# Patient Record
Sex: Male | Born: 1955 | Race: White | Hispanic: No | Marital: Married | Smoking: Never smoker
Health system: Southern US, Community
[De-identification: ages and names within clinical notes are randomized; demographics above are authoritative.]

## PROBLEM LIST (undated history)

## (undated) DIAGNOSIS — M1990 Unspecified osteoarthritis, unspecified site: Secondary | ICD-10-CM

## (undated) DIAGNOSIS — S46011A Strain of muscle(s) and tendon(s) of the rotator cuff of right shoulder, initial encounter: Secondary | ICD-10-CM

## (undated) DIAGNOSIS — G479 Sleep disorder, unspecified: Secondary | ICD-10-CM

## (undated) DIAGNOSIS — I4891 Unspecified atrial fibrillation: Secondary | ICD-10-CM

## (undated) DIAGNOSIS — I10 Essential (primary) hypertension: Secondary | ICD-10-CM

## (undated) DIAGNOSIS — Z7901 Long term (current) use of anticoagulants: Secondary | ICD-10-CM

## (undated) DIAGNOSIS — I4510 Unspecified right bundle-branch block: Secondary | ICD-10-CM

## (undated) DIAGNOSIS — E669 Obesity, unspecified: Secondary | ICD-10-CM

## (undated) DIAGNOSIS — G4730 Sleep apnea, unspecified: Secondary | ICD-10-CM

## (undated) DIAGNOSIS — E785 Hyperlipidemia, unspecified: Secondary | ICD-10-CM

## (undated) DIAGNOSIS — K219 Gastro-esophageal reflux disease without esophagitis: Secondary | ICD-10-CM

## (undated) DIAGNOSIS — E119 Type 2 diabetes mellitus without complications: Secondary | ICD-10-CM

## (undated) DIAGNOSIS — R0683 Snoring: Secondary | ICD-10-CM

## (undated) DIAGNOSIS — R7303 Prediabetes: Secondary | ICD-10-CM

## (undated) HISTORY — DX: Unspecified right bundle-branch block: I45.10

## (undated) HISTORY — DX: Unspecified atrial fibrillation: I48.91

## (undated) HISTORY — PX: FRACTURE SURGERY: SHX138

---

## 1988-12-19 DIAGNOSIS — C4490 Unspecified malignant neoplasm of skin, unspecified: Secondary | ICD-10-CM

## 1988-12-19 DIAGNOSIS — C801 Malignant (primary) neoplasm, unspecified: Secondary | ICD-10-CM

## 1988-12-19 HISTORY — DX: Unspecified malignant neoplasm of skin, unspecified: C44.90

## 1988-12-19 HISTORY — DX: Malignant (primary) neoplasm, unspecified: C80.1

## 1988-12-19 HISTORY — PX: OTHER SURGICAL HISTORY: SHX169

## 2015-12-01 ENCOUNTER — Other Ambulatory Visit
Admission: RE | Admit: 2015-12-01 | Discharge: 2015-12-01 | Disposition: A | Discharge status: Home / Self Care | Payer: 59 | Source: Ambulatory Visit | Attending: Unknown Physician Specialty | Admitting: Unknown Physician Specialty

## 2015-12-01 DIAGNOSIS — Z0289 Encounter for other administrative examinations: Secondary | ICD-10-CM | POA: Insufficient documentation

## 2015-12-01 LAB — BASIC METABOLIC PANEL
Anion gap: 12 (ref 5–15)
BUN: 15 mg/dL (ref 6–20)
CHLORIDE: 99 mmol/L — AB (ref 101–111)
CO2: 25 mmol/L (ref 22–32)
CREATININE: 0.79 mg/dL (ref 0.61–1.24)
Calcium: 9.6 mg/dL (ref 8.9–10.3)
GFR calc non Af Amer: >60 mL/min (ref >60)
Glucose, Bld: 113 mg/dL — ABNORMAL HIGH (ref 65–99)
POTASSIUM: 3.8 mmol/L (ref 3.5–5.1)
Sodium: 136 mmol/L (ref 135–145)

## 2015-12-01 LAB — CBC
HEMATOCRIT: 47.1 % (ref 40.0–52.0)
HEMOGLOBIN: 15.7 g/dL (ref 13.0–18.0)
MCH: 29.5 pg (ref 26.0–34.0)
MCHC: 33.3 g/dL (ref 32.0–36.0)
MCV: 88.5 fL (ref 80.0–100.0)
Platelets: 188 10*3/uL (ref 150–440)
RBC: 5.32 MIL/uL (ref 4.40–5.90)
RDW: 13.7 % (ref 11.5–14.5)
WBC: 8.8 10*3/uL (ref 3.8–10.6)

## 2015-12-01 LAB — HEPATIC FUNCTION PANEL
ALBUMIN: 4 g/dL (ref 3.5–5.0)
ALK PHOS: 64 U/L (ref 38–126)
ALT: 26 U/L (ref 17–63)
AST: 21 U/L (ref 15–41)
BILIRUBIN TOTAL: 0.7 mg/dL (ref 0.3–1.2)
Bilirubin, Direct: 0.1 mg/dL (ref 0.1–0.5)
Indirect Bilirubin: 0.6 mg/dL (ref 0.3–0.9)
TOTAL PROTEIN: 6.9 g/dL (ref 6.5–8.1)

## 2017-05-09 DIAGNOSIS — E119 Type 2 diabetes mellitus without complications: Secondary | ICD-10-CM | POA: Diagnosis not present

## 2017-05-09 DIAGNOSIS — E7800 Pure hypercholesterolemia, unspecified: Secondary | ICD-10-CM | POA: Diagnosis not present

## 2017-05-09 DIAGNOSIS — Z0000 Encounter for general adult medical examination without abnormal findings: Secondary | ICD-10-CM | POA: Diagnosis not present

## 2017-05-28 DIAGNOSIS — S4992XA Unspecified injury of left shoulder and upper arm, initial encounter: Secondary | ICD-10-CM | POA: Diagnosis not present

## 2017-05-28 DIAGNOSIS — M25512 Pain in left shoulder: Secondary | ICD-10-CM | POA: Diagnosis not present

## 2017-05-28 DIAGNOSIS — S46912A Strain of unspecified muscle, fascia and tendon at shoulder and upper arm level, left arm, initial encounter: Secondary | ICD-10-CM | POA: Diagnosis not present

## 2017-05-28 DIAGNOSIS — G8911 Acute pain due to trauma: Secondary | ICD-10-CM | POA: Diagnosis not present

## 2017-06-30 DIAGNOSIS — M5431 Sciatica, right side: Secondary | ICD-10-CM | POA: Diagnosis not present

## 2017-06-30 DIAGNOSIS — M1611 Unilateral primary osteoarthritis, right hip: Secondary | ICD-10-CM | POA: Diagnosis not present

## 2017-06-30 DIAGNOSIS — M5136 Other intervertebral disc degeneration, lumbar region: Secondary | ICD-10-CM | POA: Diagnosis not present

## 2017-06-30 DIAGNOSIS — M25551 Pain in right hip: Secondary | ICD-10-CM | POA: Diagnosis not present

## 2017-09-16 DIAGNOSIS — Z23 Encounter for immunization: Secondary | ICD-10-CM | POA: Diagnosis not present

## 2017-11-06 DIAGNOSIS — E7800 Pure hypercholesterolemia, unspecified: Secondary | ICD-10-CM | POA: Diagnosis not present

## 2017-11-06 DIAGNOSIS — I10 Essential (primary) hypertension: Secondary | ICD-10-CM | POA: Diagnosis not present

## 2017-11-06 DIAGNOSIS — E119 Type 2 diabetes mellitus without complications: Secondary | ICD-10-CM | POA: Diagnosis not present

## 2018-09-25 DIAGNOSIS — L02411 Cutaneous abscess of right axilla: Secondary | ICD-10-CM | POA: Diagnosis not present

## 2018-09-25 DIAGNOSIS — E119 Type 2 diabetes mellitus without complications: Secondary | ICD-10-CM | POA: Diagnosis not present

## 2018-10-05 DIAGNOSIS — I10 Essential (primary) hypertension: Secondary | ICD-10-CM | POA: Diagnosis not present

## 2018-10-05 DIAGNOSIS — E7800 Pure hypercholesterolemia, unspecified: Secondary | ICD-10-CM | POA: Diagnosis not present

## 2018-10-05 DIAGNOSIS — E119 Type 2 diabetes mellitus without complications: Secondary | ICD-10-CM | POA: Diagnosis not present

## 2018-10-12 DIAGNOSIS — Z0000 Encounter for general adult medical examination without abnormal findings: Secondary | ICD-10-CM | POA: Diagnosis not present

## 2018-10-12 DIAGNOSIS — E118 Type 2 diabetes mellitus with unspecified complications: Secondary | ICD-10-CM | POA: Diagnosis not present

## 2018-10-12 DIAGNOSIS — I10 Essential (primary) hypertension: Secondary | ICD-10-CM | POA: Diagnosis not present

## 2018-10-12 DIAGNOSIS — E7800 Pure hypercholesterolemia, unspecified: Secondary | ICD-10-CM | POA: Diagnosis not present

## 2018-10-12 DIAGNOSIS — Z23 Encounter for immunization: Secondary | ICD-10-CM | POA: Diagnosis not present

## 2018-10-18 DIAGNOSIS — I10 Essential (primary) hypertension: Secondary | ICD-10-CM | POA: Diagnosis not present

## 2018-10-18 DIAGNOSIS — E7800 Pure hypercholesterolemia, unspecified: Secondary | ICD-10-CM | POA: Diagnosis not present

## 2018-10-18 DIAGNOSIS — E118 Type 2 diabetes mellitus with unspecified complications: Secondary | ICD-10-CM | POA: Diagnosis not present

## 2018-10-18 DIAGNOSIS — Z23 Encounter for immunization: Secondary | ICD-10-CM | POA: Diagnosis not present

## 2018-10-18 DIAGNOSIS — Z0000 Encounter for general adult medical examination without abnormal findings: Secondary | ICD-10-CM | POA: Diagnosis not present

## 2018-11-13 DIAGNOSIS — L739 Follicular disorder, unspecified: Secondary | ICD-10-CM | POA: Diagnosis not present

## 2018-11-20 DIAGNOSIS — L0390 Cellulitis, unspecified: Secondary | ICD-10-CM | POA: Diagnosis not present

## 2018-11-20 DIAGNOSIS — B9562 Methicillin resistant Staphylococcus aureus infection as the cause of diseases classified elsewhere: Secondary | ICD-10-CM | POA: Diagnosis not present

## 2019-04-05 DIAGNOSIS — I10 Essential (primary) hypertension: Secondary | ICD-10-CM | POA: Diagnosis not present

## 2019-04-05 DIAGNOSIS — E7800 Pure hypercholesterolemia, unspecified: Secondary | ICD-10-CM | POA: Diagnosis not present

## 2019-04-05 DIAGNOSIS — Z125 Encounter for screening for malignant neoplasm of prostate: Secondary | ICD-10-CM | POA: Diagnosis not present

## 2019-04-05 DIAGNOSIS — E118 Type 2 diabetes mellitus with unspecified complications: Secondary | ICD-10-CM | POA: Diagnosis not present

## 2019-04-11 DIAGNOSIS — I10 Essential (primary) hypertension: Secondary | ICD-10-CM | POA: Diagnosis not present

## 2019-04-11 DIAGNOSIS — E118 Type 2 diabetes mellitus with unspecified complications: Secondary | ICD-10-CM | POA: Diagnosis not present

## 2019-04-11 DIAGNOSIS — E7800 Pure hypercholesterolemia, unspecified: Secondary | ICD-10-CM | POA: Diagnosis not present

## 2019-05-31 ENCOUNTER — Telehealth: Payer: Self-pay | Admitting: Hematology

## 2019-05-31 DIAGNOSIS — Z20822 Contact with and (suspected) exposure to covid-19: Secondary | ICD-10-CM

## 2019-05-31 NOTE — Telephone Encounter (Signed)
Patient referred for COVID testing by Dr. Valere Dross / Left VM to call back [phone] between 7a-7p M-F / Order placed / If patient calls back he just needs to be scheduled at [facility]

## 2019-06-04 NOTE — Telephone Encounter (Signed)
Patient returned call and has been scheduled for testing.

## 2019-06-05 ENCOUNTER — Other Ambulatory Visit: Payer: 59

## 2019-06-05 DIAGNOSIS — Z20822 Contact with and (suspected) exposure to covid-19: Secondary | ICD-10-CM

## 2019-06-06 LAB — NOVEL CORONAVIRUS, NAA: SARS-CoV-2, NAA: NOT DETECTED

## 2020-03-27 ENCOUNTER — Ambulatory Visit: Payer: Self-pay | Attending: Internal Medicine

## 2020-03-27 DIAGNOSIS — Z23 Encounter for immunization: Secondary | ICD-10-CM

## 2020-03-27 NOTE — Progress Notes (Signed)
   Covid-19 Vaccination Clinic  Name:  TREZ CONLIFFE    MRN: [id number] DOB: May 27, 1956  03/27/2020  Mr. Coriell was observed post Covid-19 immunization for 15 minutes without incident. He was provided with Vaccine Information Sheet and instruction to access the V-Safe system.   Mr. Cubias was instructed to call 911 with any severe reactions post vaccine: Marland Kitchen Difficulty breathing  . Swelling of face and throat  . A fast heartbeat  . A bad rash all over body  . Dizziness and weakness   Immunizations Administered    Name Date Dose VIS Date Route   Pfizer COVID-19 Vaccine 03/27/2020 11:29 AM 0.3 mL 11/29/2019 Intramuscular   Manufacturer: [place]   Lot: [id number]   [place]: [id number]

## 2020-04-21 ENCOUNTER — Ambulatory Visit: Payer: Self-pay | Attending: Internal Medicine

## 2020-04-21 DIAGNOSIS — Z23 Encounter for immunization: Secondary | ICD-10-CM

## 2020-04-21 NOTE — Progress Notes (Signed)
   Covid-19 Vaccination Clinic  Name:  DELSHAWN COKLEY    MRN: [id number] DOB: Dec 23, 1955  04/21/2020  Mr. Claytor was observed post Covid-19 immunization for 15 minutes without incident. He was provided with Vaccine Information Sheet and instruction to access the V-Safe system.   Mr. Trester was instructed to call 911 with any severe reactions post vaccine: Marland Kitchen Difficulty breathing  . Swelling of face and throat  . A fast heartbeat  . A bad rash all over body  . Dizziness and weakness   Immunizations Administered    Name Date Dose VIS Date Route   Pfizer COVID-19 Vaccine 04/21/2020  1:20 PM 0.3 mL 02/12/2019 Intramuscular   Manufacturer: [place]   Lot: [id number]   [place]: [id number]

## 2020-12-16 ENCOUNTER — Other Ambulatory Visit: Payer: Self-pay

## 2020-12-16 ENCOUNTER — Other Ambulatory Visit: Payer: BC Managed Care – PPO

## 2020-12-16 DIAGNOSIS — Z20822 Contact with and (suspected) exposure to covid-19: Secondary | ICD-10-CM

## 2020-12-17 LAB — SARS-COV-2, NAA 2 DAY TAT

## 2020-12-17 LAB — NOVEL CORONAVIRUS, NAA: SARS-CoV-2, NAA: NOT DETECTED

## 2020-12-25 ENCOUNTER — Other Ambulatory Visit: Payer: Self-pay | Admitting: Student

## 2020-12-25 DIAGNOSIS — M7581 Other shoulder lesions, right shoulder: Secondary | ICD-10-CM

## 2020-12-28 ENCOUNTER — Other Ambulatory Visit: Payer: Self-pay | Admitting: Student

## 2020-12-28 DIAGNOSIS — M7581 Other shoulder lesions, right shoulder: Secondary | ICD-10-CM

## 2021-01-08 ENCOUNTER — Other Ambulatory Visit: Payer: BC Managed Care – PPO

## 2021-01-19 ENCOUNTER — Ambulatory Visit
Admission: RE | Admit: 2021-01-19 | Discharge: 2021-01-19 | Disposition: A | Discharge status: Home / Self Care | Payer: BC Managed Care – PPO | Source: Ambulatory Visit | Attending: Student | Admitting: Student

## 2021-01-19 ENCOUNTER — Other Ambulatory Visit: Payer: Self-pay

## 2021-01-19 DIAGNOSIS — M7581 Other shoulder lesions, right shoulder: Secondary | ICD-10-CM

## 2021-04-07 ENCOUNTER — Other Ambulatory Visit: Payer: Self-pay | Admitting: Surgery

## 2021-04-16 ENCOUNTER — Other Ambulatory Visit
Admission: RE | Admit: 2021-04-16 | Discharge: 2021-04-16 | Disposition: A | Discharge status: Home / Self Care | Payer: BC Managed Care – PPO | Source: Ambulatory Visit | Attending: Surgery | Admitting: Surgery

## 2021-04-16 ENCOUNTER — Other Ambulatory Visit: Payer: Self-pay

## 2021-04-16 HISTORY — DX: Prediabetes: R73.03

## 2021-04-16 HISTORY — DX: Unspecified osteoarthritis, unspecified site: M19.90

## 2021-04-16 HISTORY — DX: Gastro-esophageal reflux disease without esophagitis: K21.9

## 2021-04-16 HISTORY — DX: Essential (primary) hypertension: I10

## 2021-04-16 NOTE — Patient Instructions (Signed)
Your procedure is scheduled on: Tuesday Apr 27, 2021. Report to Day Surgery inside [facility] 2nd floor (stop by admissions desk first before getting on elevator). To find out your arrival time please call [phone] between 1PM - 3PM on Monday Apr 26, 2021.  Remember: Instructions that are not followed completely may result in serious medical risk,  up to and including death, or upon the discretion of your surgeon and anesthesiologist your  surgery may need to be rescheduled.     _X__ 1. Do not eat food after midnight the night before your procedure.                 No chewing gum or hard candies. You may drink clear liquids up to 2 hours                 before you are scheduled to arrive for your surgery- DO not drink clear                 liquids within 2 hours of the start of your surgery.                 Clear Liquids include:  water, apple juice without pulp, G2 or                  Gatorade Zero (avoid Red/Purple/Blue), Black Coffee or Tea (Do not add                 anything to coffee or tea).  __X__2.  On the morning of surgery brush your teeth with toothpaste and water, you                may rinse your mouth with mouthwash if you wish.  Do not swallow any toothpaste of mouthwash.     _X__ 3.  No Alcohol for 24 hours before or after surgery.   _X__ 4.  Do Not Smoke or use e-cigarettes For 24 Hours Prior to Your Surgery.                 Do not use any chewable tobacco products for at least 6 hours prior to                 Surgery.  _X__  5.  Do not use any recreational drugs (marijuana, cocaine, heroin, ecstasy, MDMA or other)                For at least one week prior to your surgery.  Combination of these drugs with anesthesia                May have life threatening results.   __X__6.  Notify your doctor if there is any change in your medical condition      (cold, fever, infections).     Do not wear jewelry, make-up, hairpins, clips or nail  polish. Do not wear lotions, powders, or perfumes. You may wear deodorant. Do not shave 48 hours prior to surgery. Men may shave face and neck. Do not bring valuables to the hospital.    [facility] is not responsible for any belongings or valuables.  Contacts, dentures or bridgework may not be worn into surgery. Leave your suitcase in the car. After surgery it may be brought to your room. For patients admitted to the hospital, discharge time is determined by your treatment team.   Patients discharged the day of surgery will not be allowed to drive home.  Make arrangements for someone to be with you for the first 24 hours of your Same Day Discharge.   __X__ Take these medicines the morning of surgery with A SIP OF WATER:    1. amLODipine (NORVASC) 5 MG   2. omeprazole (PRILOSEC) 40 MG  3.   4.  5.  6.  ____ Fleet Enema (as directed)   __X__ Use CHG Soap (or wipes) as directed  ____ Use Benzoyl Peroxide Gel as instructed  ____ Use inhalers on the day of surgery  ____ Stop metformin 2 days prior to surgery    ____ Take 1/2 of usual insulin dose the night before surgery. No insulin the morning          of surgery.   ____ Call your PCP, cardiologist, or Pulmonologist if taking Coumadin/Plavix/aspirin and ask when to stop before your surgery.   __X__ One Week prior to surgery- Stop Anti-inflammatories such as Ibuprofen, Aleve, Advil, Motrin, meloxicam (MOBIC), diclofenac, etodolac, ketorolac, Toradol, Daypro, piroxicam, Goody's or BC powders. OK TO USE TYLENOL IF NEEDED   __X__ Stop supplements until after surgery. Multiple Vitamin (MULTIVITAMIN WITH MINERALS), Melatonin and Cyanocobalamin (VITAMIN B-12)   ____ Bring C-Pap to the hospital.    If you have any questions regarding your pre-procedure instructions,  Please call Pre-admit Testing at [phone].

## 2021-04-21 ENCOUNTER — Other Ambulatory Visit: Payer: Self-pay

## 2021-04-21 ENCOUNTER — Encounter
Admission: RE | Admit: 2021-04-21 | Discharge: 2021-04-21 | Disposition: A | Discharge status: Home / Self Care | Payer: BC Managed Care – PPO | Source: Ambulatory Visit | Attending: Surgery | Admitting: Surgery

## 2021-04-21 ENCOUNTER — Other Ambulatory Visit: Admission: RE | Admit: 2021-04-21 | Payer: BC Managed Care – PPO | Source: Ambulatory Visit

## 2021-04-21 DIAGNOSIS — I10 Essential (primary) hypertension: Secondary | ICD-10-CM | POA: Insufficient documentation

## 2021-04-21 DIAGNOSIS — Z01818 Encounter for other preprocedural examination: Secondary | ICD-10-CM | POA: Insufficient documentation

## 2021-04-21 LAB — CBC
HCT: 46.5 % (ref 39.0–52.0)
Hemoglobin: 15.7 g/dL (ref 13.0–17.0)
MCH: 29.8 pg (ref 26.0–34.0)
MCHC: 33.8 g/dL (ref 30.0–36.0)
MCV: 88.2 fL (ref 80.0–100.0)
Platelets: 222 10*3/uL (ref 150–400)
RBC: 5.27 MIL/uL (ref 4.22–5.81)
RDW: 13.2 % (ref 11.5–15.5)
WBC: 8.4 10*3/uL (ref 4.0–10.5)
nRBC: 0 % (ref 0.0–0.2)

## 2021-04-26 MED ORDER — CHLORHEXIDINE GLUCONATE 0.12 % MT SOLN: 15.0000 mL | Freq: Once | OROMUCOSAL | Status: AC

## 2021-04-26 MED ORDER — ORAL CARE MOUTH RINSE: 15.0000 mL | Freq: Once | OROMUCOSAL | Status: AC

## 2021-04-26 MED ORDER — CEFAZOLIN IN SODIUM CHLORIDE 3-0.9 GM/100ML-% IV SOLN
3.0000 g | INTRAVENOUS | Status: DC
  Filled 2021-04-26: qty 100

## 2021-04-26 MED ORDER — SODIUM CHLORIDE 0.9 % IV SOLN: INTRAVENOUS | Status: DC

## 2021-04-27 ENCOUNTER — Other Ambulatory Visit: Payer: Self-pay

## 2021-04-27 ENCOUNTER — Encounter: Payer: Self-pay | Admitting: Anesthesiology

## 2021-04-27 ENCOUNTER — Encounter: Payer: Self-pay | Admitting: Surgery

## 2021-04-27 ENCOUNTER — Ambulatory Visit: Payer: BC Managed Care – PPO

## 2021-04-27 ENCOUNTER — Observation Stay
Admission: EM | Admit: 2021-04-27 | Discharge: 2021-04-28 | Disposition: A | Discharge status: Home / Self Care | Payer: BC Managed Care – PPO | Attending: Internal Medicine | Admitting: Internal Medicine

## 2021-04-27 ENCOUNTER — Encounter
Admission: RE | Disposition: A | Discharge status: Home / Self Care | Payer: Self-pay | Source: Home / Self Care | Attending: Surgery

## 2021-04-27 ENCOUNTER — Observation Stay (HOSPITAL_BASED_OUTPATIENT_CLINIC_OR_DEPARTMENT_OTHER)
Admit: 2021-04-27 | Discharge: 2021-04-27 | Disposition: A | Discharge status: Home / Self Care | Payer: BC Managed Care – PPO | Attending: Internal Medicine | Admitting: Internal Medicine

## 2021-04-27 ENCOUNTER — Ambulatory Visit
Admission: RE | Admit: 2021-04-27 | Discharge: 2021-04-27 | Disposition: A | Discharge status: Home / Self Care | Payer: BC Managed Care – PPO | Source: Home / Self Care | Attending: Surgery | Admitting: Surgery

## 2021-04-27 ENCOUNTER — Emergency Department: Payer: BC Managed Care – PPO

## 2021-04-27 DIAGNOSIS — Z01818 Encounter for other preprocedural examination: Secondary | ICD-10-CM | POA: Insufficient documentation

## 2021-04-27 DIAGNOSIS — Z85828 Personal history of other malignant neoplasm of skin: Secondary | ICD-10-CM | POA: Insufficient documentation

## 2021-04-27 DIAGNOSIS — E119 Type 2 diabetes mellitus without complications: Secondary | ICD-10-CM | POA: Diagnosis not present

## 2021-04-27 DIAGNOSIS — E782 Mixed hyperlipidemia: Secondary | ICD-10-CM

## 2021-04-27 DIAGNOSIS — Z79899 Other long term (current) drug therapy: Secondary | ICD-10-CM | POA: Diagnosis not present

## 2021-04-27 DIAGNOSIS — Z20822 Contact with and (suspected) exposure to covid-19: Secondary | ICD-10-CM | POA: Diagnosis not present

## 2021-04-27 DIAGNOSIS — R0602 Shortness of breath: Secondary | ICD-10-CM | POA: Insufficient documentation

## 2021-04-27 DIAGNOSIS — I4891 Unspecified atrial fibrillation: Secondary | ICD-10-CM

## 2021-04-27 DIAGNOSIS — E65 Localized adiposity: Secondary | ICD-10-CM

## 2021-04-27 DIAGNOSIS — Z419 Encounter for procedure for purposes other than remedying health state, unspecified: Secondary | ICD-10-CM

## 2021-04-27 DIAGNOSIS — E669 Obesity, unspecified: Secondary | ICD-10-CM

## 2021-04-27 DIAGNOSIS — I10 Essential (primary) hypertension: Secondary | ICD-10-CM | POA: Diagnosis not present

## 2021-04-27 DIAGNOSIS — I480 Paroxysmal atrial fibrillation: Secondary | ICD-10-CM

## 2021-04-27 DIAGNOSIS — M75101 Unspecified rotator cuff tear or rupture of right shoulder, not specified as traumatic: Secondary | ICD-10-CM

## 2021-04-27 DIAGNOSIS — R9431 Abnormal electrocardiogram [ECG] [EKG]: Secondary | ICD-10-CM

## 2021-04-27 DIAGNOSIS — E785 Hyperlipidemia, unspecified: Secondary | ICD-10-CM

## 2021-04-27 DIAGNOSIS — Z538 Procedure and treatment not carried out for other reasons: Secondary | ICD-10-CM | POA: Insufficient documentation

## 2021-04-27 DIAGNOSIS — E1169 Type 2 diabetes mellitus with other specified complication: Secondary | ICD-10-CM

## 2021-04-27 HISTORY — DX: Type 2 diabetes mellitus without complications: E11.9

## 2021-04-27 HISTORY — DX: Unspecified right bundle-branch block: I45.10

## 2021-04-27 HISTORY — DX: Obesity, unspecified: E66.9

## 2021-04-27 HISTORY — DX: Snoring: R06.83

## 2021-04-27 HISTORY — DX: Hyperlipidemia, unspecified: E78.5

## 2021-04-27 LAB — CBC
HCT: 48.1 % (ref 39.0–52.0)
Hemoglobin: 16.6 g/dL (ref 13.0–17.0)
MCH: 30.5 pg (ref 26.0–34.0)
MCHC: 34.5 g/dL (ref 30.0–36.0)
MCV: 88.3 fL (ref 80.0–100.0)
Platelets: 232 10*3/uL (ref 150–400)
RBC: 5.45 MIL/uL (ref 4.22–5.81)
RDW: 13.4 % (ref 11.5–15.5)
WBC: 11.3 10*3/uL — ABNORMAL HIGH (ref 4.0–10.5)
nRBC: 0 % (ref 0.0–0.2)

## 2021-04-27 LAB — GLUCOSE, CAPILLARY
Glucose-Capillary: 122 mg/dL — ABNORMAL HIGH (ref 70–99)
Glucose-Capillary: 124 mg/dL — ABNORMAL HIGH (ref 70–99)

## 2021-04-27 LAB — BASIC METABOLIC PANEL
Anion gap: 11 (ref 5–15)
BUN: 20 mg/dL (ref 8–23)
CO2: 22 mmol/L (ref 22–32)
Calcium: 9.4 mg/dL (ref 8.9–10.3)
Chloride: 103 mmol/L (ref 98–111)
Creatinine, Ser: 0.87 mg/dL (ref 0.61–1.24)
GFR, Estimated: >60 mL/min (ref >60)
Glucose, Bld: 114 mg/dL — ABNORMAL HIGH (ref 70–99)
Potassium: 4 mmol/L (ref 3.5–5.1)
Sodium: 136 mmol/L (ref 135–145)

## 2021-04-27 LAB — TROPONIN I (HIGH SENSITIVITY)
Troponin I (High Sensitivity): 4 ng/L (ref <18)
Troponin I (High Sensitivity): 4 ng/L (ref <18)

## 2021-04-27 LAB — HIV ANTIBODY (ROUTINE TESTING W REFLEX): HIV Screen 4th Generation wRfx: NONREACTIVE

## 2021-04-27 LAB — TSH: TSH: 1.948 u[IU]/mL (ref 0.350–4.500)

## 2021-04-27 LAB — RESP PANEL BY RT-PCR (FLU A&B, COVID) ARPGX2
Influenza A by PCR: NEGATIVE
Influenza B by PCR: NEGATIVE
SARS Coronavirus 2 by RT PCR: NEGATIVE

## 2021-04-27 LAB — BRAIN NATRIURETIC PEPTIDE: B Natriuretic Peptide: 395.7 pg/mL — ABNORMAL HIGH (ref 0.0–100.0)

## 2021-04-27 LAB — PROCALCITONIN: Procalcitonin: <0.1 ng/mL

## 2021-04-27 LAB — MAGNESIUM: Magnesium: 2.1 mg/dL (ref 1.7–2.4)

## 2021-04-27 SURGERY — ARTHROSCOPY, SHOULDER WITH REPAIR, ROTATOR CUFF, OPEN
Anesthesia: Choice | Site: Shoulder | Laterality: Right

## 2021-04-27 MED ORDER — CHLORHEXIDINE GLUCONATE 0.12 % MT SOLN
OROMUCOSAL | Status: AC
  Administered 2021-04-27: 15 mL via OROMUCOSAL
  Filled 2021-04-27: qty 15

## 2021-04-27 MED ORDER — ONDANSETRON HCL 4 MG/2ML IJ SOLN: 4.0000 mg | Freq: Four times a day (QID) | INTRAMUSCULAR | Status: DC | PRN

## 2021-04-27 MED ORDER — DILTIAZEM LOAD VIA INFUSION
10.0000 mg | Freq: Once | INTRAVENOUS | Status: AC
  Administered 2021-04-27: 10 mg via INTRAVENOUS
  Filled 2021-04-27: qty 10

## 2021-04-27 MED ORDER — ACETAMINOPHEN 650 MG RE SUPP: 650.0000 mg | Freq: Four times a day (QID) | RECTAL | Status: DC | PRN

## 2021-04-27 MED ORDER — MELATONIN 5 MG PO TABS
10.0000 mg | ORAL_TABLET | Freq: Every evening | ORAL | Status: DC | PRN
  Administered 2021-04-27: 10 mg via ORAL
  Filled 2021-04-27: qty 2

## 2021-04-27 MED ORDER — METOPROLOL TARTRATE 5 MG/5ML IV SOLN
INTRAVENOUS | Status: AC
  Filled 2021-04-27: qty 5

## 2021-04-27 MED ORDER — MIDAZOLAM HCL 2 MG/2ML IJ SOLN: 2.0000 mg | Freq: Once | INTRAMUSCULAR | Status: DC

## 2021-04-27 MED ORDER — VITAMIN B-12 5000 MCG SL SUBL: 5000.0000 ug | SUBLINGUAL_TABLET | Freq: Every morning | SUBLINGUAL | Status: DC

## 2021-04-27 MED ORDER — VITAMIN B-12 1000 MCG PO TABS
5000.0000 ug | ORAL_TABLET | Freq: Every day | ORAL | Status: DC
  Administered 2021-04-28: 5000 ug via ORAL
  Filled 2021-04-27: qty 5

## 2021-04-27 MED ORDER — MIDAZOLAM HCL 2 MG/2ML IJ SOLN
INTRAMUSCULAR | Status: AC
  Filled 2021-04-27: qty 2

## 2021-04-27 MED ORDER — ACETAMINOPHEN 325 MG PO TABS
650.0000 mg | ORAL_TABLET | Freq: Four times a day (QID) | ORAL | Status: DC | PRN
  Administered 2021-04-27: 650 mg via ORAL
  Filled 2021-04-27: qty 2

## 2021-04-27 MED ORDER — FENTANYL CITRATE (PF) 100 MCG/2ML IJ SOLN
INTRAMUSCULAR | Status: AC
  Filled 2021-04-27: qty 2

## 2021-04-27 MED ORDER — DILTIAZEM HCL-DEXTROSE 125-5 MG/125ML-% IV SOLN (PREMIX)
5.0000 mg/h | INTRAVENOUS | Status: DC
Start: 2021-04-27 — End: 2021-04-28
  Administered 2021-04-27: 7.5 mg/h via INTRAVENOUS
  Administered 2021-04-27: 5 mg/h via INTRAVENOUS
  Administered 2021-04-27 – 2021-04-28 (×2): 12.5 mg/h via INTRAVENOUS
  Filled 2021-04-27 (×3): qty 125

## 2021-04-27 MED ORDER — APIXABAN 5 MG PO TABS
5.0000 mg | ORAL_TABLET | Freq: Two times a day (BID) | ORAL | Status: DC
  Administered 2021-04-27 – 2021-04-28 (×2): 5 mg via ORAL
  Filled 2021-04-27 (×2): qty 1

## 2021-04-27 MED ORDER — ROPIVACAINE HCL 5 MG/ML IJ SOLN
INTRAMUSCULAR | Status: AC
  Filled 2021-04-27: qty 30

## 2021-04-27 MED ORDER — INSULIN ASPART 100 UNIT/ML IJ SOLN
0.0000 [IU] | Freq: Every day | INTRAMUSCULAR | Status: DC
Start: 2021-04-27 — End: 2021-04-28

## 2021-04-27 MED ORDER — FENTANYL CITRATE (PF) 100 MCG/2ML IJ SOLN: 100.0000 ug | Freq: Once | INTRAMUSCULAR | Status: DC

## 2021-04-27 MED ORDER — INSULIN ASPART 100 UNIT/ML IJ SOLN
0.0000 [IU] | Freq: Three times a day (TID) | INTRAMUSCULAR | Status: DC
Start: 2021-04-28 — End: 2021-04-28
  Administered 2021-04-28: 2 [IU] via SUBCUTANEOUS
  Administered 2021-04-28: 1 [IU] via SUBCUTANEOUS
  Filled 2021-04-27 (×2): qty 1

## 2021-04-27 MED ORDER — ROSUVASTATIN CALCIUM 20 MG PO TABS
40.0000 mg | ORAL_TABLET | Freq: Every day | ORAL | Status: DC
  Administered 2021-04-27: 40 mg via ORAL
  Filled 2021-04-27 (×2): qty 2

## 2021-04-27 MED ORDER — ONDANSETRON HCL 4 MG PO TABS: 4.0000 mg | ORAL_TABLET | Freq: Four times a day (QID) | ORAL | Status: DC | PRN

## 2021-04-27 MED ORDER — PANTOPRAZOLE SODIUM 40 MG PO TBEC
80.0000 mg | DELAYED_RELEASE_TABLET | Freq: Every day | ORAL | Status: DC
  Administered 2021-04-28: 80 mg via ORAL
  Filled 2021-04-27: qty 2

## 2021-04-27 MED ORDER — KETOROLAC TROMETHAMINE 15 MG/ML IJ SOLN
15.0000 mg | Freq: Four times a day (QID) | INTRAMUSCULAR | Status: DC | PRN
  Filled 2021-04-27 (×2): qty 1

## 2021-04-27 MED ORDER — PERFLUTREN LIPID MICROSPHERE
1.0000 mL | INTRAVENOUS | Status: AC | PRN
  Administered 2021-04-27: 2 mL via INTRAVENOUS
  Filled 2021-04-27: qty 10

## 2021-04-27 MED ORDER — ROSUVASTATIN CALCIUM 20 MG PO TABS: 40.0000 mg | ORAL_TABLET | Freq: Every morning | ORAL | Status: DC

## 2021-04-27 MED ORDER — AMLODIPINE BESYLATE 5 MG PO TABS: 5.0000 mg | ORAL_TABLET | Freq: Every day | ORAL | Status: DC

## 2021-04-27 MED ORDER — ADULT MULTIVITAMIN W/MINERALS CH
1.0000 | ORAL_TABLET | Freq: Every morning | ORAL | Status: DC
  Administered 2021-04-28: 1 via ORAL
  Filled 2021-04-27: qty 1

## 2021-04-27 SURGICAL SUPPLY — 47 items
ANCHOR JUGGERKNOT WTAP NDL 2.9 (Anchor) IMPLANT
ANCHOR QUATTRO KNOTLESS 4.5MM (Anchor) IMPLANT
ANCHOR SUT W/ ORTHOCORD (Anchor) IMPLANT
BIT DRILL JUGRKNT W/NDL BIT2.9 (DRILL) IMPLANT
BLADE FULL RADIUS 3.5 (BLADE) ×2 IMPLANT
BUR ACROMIONIZER 4.0 (BURR) ×2 IMPLANT
CANNULA SHAVER 8MMX76MM (CANNULA) ×2 IMPLANT
CHLORAPREP W/TINT 26 (MISCELLANEOUS) ×2 IMPLANT
COVER MAYO STAND REUSABLE (DRAPES) ×2 IMPLANT
COVER WAND RF STERILE (DRAPES) ×2 IMPLANT
DRAPE IMP U-DRAPE 54X76 (DRAPES) ×4 IMPLANT
DRILL JUGGERKNOT W/NDL BIT 2.9 (DRILL)
ELECT CAUTERY BLADE 6.4 (BLADE) ×2 IMPLANT
ELECT REM PT RETURN 9FT ADLT (ELECTROSURGICAL) ×2
ELECTRODE REM PT RTRN 9FT ADLT (ELECTROSURGICAL) ×1 IMPLANT
GAUZE SPONGE 4X4 12PLY STRL (GAUZE/BANDAGES/DRESSINGS) ×2 IMPLANT
GAUZE XEROFORM 1X8 LF (GAUZE/BANDAGES/DRESSINGS) ×2 IMPLANT
GLOVE SRG 8 PF TXTR STRL LF DI (GLOVE) ×1 IMPLANT
GLOVE SURG ENC MOIS LTX SZ7.5 (GLOVE) ×4 IMPLANT
GLOVE SURG ENC MOIS LTX SZ8 (GLOVE) ×4 IMPLANT
GLOVE SURG UNDER LTX SZ8 (GLOVE) ×2 IMPLANT
GLOVE SURG UNDER POLY LF SZ8 (GLOVE) ×1
GOWN STRL REUS W/ TWL LRG LVL3 (GOWN DISPOSABLE) ×1 IMPLANT
GOWN STRL REUS W/ TWL XL LVL3 (GOWN DISPOSABLE) ×1 IMPLANT
GOWN STRL REUS W/TWL LRG LVL3 (GOWN DISPOSABLE) ×1
GOWN STRL REUS W/TWL XL LVL3 (GOWN DISPOSABLE) ×1
GRASPER SUT 15 45D LOW PRO (SUTURE) IMPLANT
IV LACTATED RINGER IRRG 3000ML (IV SOLUTION) ×2
IV LR IRRIG 3000ML ARTHROMATIC (IV SOLUTION) ×2 IMPLANT
KIT CANNULA 8X76-LX IN CANNULA (CANNULA) IMPLANT
MANIFOLD NEPTUNE II (INSTRUMENTS) ×4 IMPLANT
MASK FACE SPIDER DISP (MASK) ×2 IMPLANT
MAT ABSORB  FLUID 56X50 GRAY (MISCELLANEOUS) ×1
MAT ABSORB FLUID 56X50 GRAY (MISCELLANEOUS) ×1 IMPLANT
PACK ARTHROSCOPY SHOULDER (MISCELLANEOUS) ×2 IMPLANT
PASSER SUT FIRSTPASS SELF (INSTRUMENTS) IMPLANT
SLING ARM LRG DEEP (SOFTGOODS) ×2 IMPLANT
SLING ULTRA II LG (MISCELLANEOUS) ×2 IMPLANT
STAPLER SKIN PROX 35W (STAPLE) ×2 IMPLANT
STRAP SAFETY 5IN WIDE (MISCELLANEOUS) ×2 IMPLANT
SUT ETHIBOND 0 MO6 C/R (SUTURE) ×2 IMPLANT
SUT ULTRABRAID 2 COBRAID 38 (SUTURE) IMPLANT
SUT VIC AB 2-0 CT1 27 (SUTURE) ×2
SUT VIC AB 2-0 CT1 TAPERPNT 27 (SUTURE) ×2 IMPLANT
TAPE MICROFOAM 4IN (TAPE) ×2 IMPLANT
TUBING ARTHRO INFLOW-ONLY STRL (TUBING) ×2 IMPLANT
WAND WEREWOLF FLOW 90D (MISCELLANEOUS) ×2 IMPLANT

## 2021-04-27 NOTE — H&P (Addendum)
History and Physical   Connor Freeman [id number] DOB: 1956/01/03 DOA: 04/27/2021  PCP: Kirk Ruths, MD  Outpatient Specialists: Dr. Roland Rack, orthopedic surgeon Patient coming from: Outpatient orthopedic surgery  I have personally briefly reviewed patient's old medical records in [facility] EMR.  Chief Concern: New A. fib  HPI: Connor Freeman is a 65 y.o. male with medical history significant for hypertension, non-insulin-dependent diabetes mellitus, truncal obesity, hyperlipidemia, recent traumatic complete tear of the right rotator cuff, presents from outpatient orthopedic surgery to the ED for chief concerns of new atrial fibrillation with RVR.  [name] was at outpatient surgery for right shoulder arthroscopy with debridement, decompression, and rotator cuff repair when during the preop work-up, EKG showed atrial fibrillation with RVR.  At bedside, [name] is awake alert and oriented x4, he denies dysphagia, vision changes, nausea, vomiting, fever, cough, tick bites, chest pain, shortness of breath, abdominal pain, dysuria, hematuria, weight loss, syncope, loss of consciousness, swelling in his lower extremity.  He denies history of GI bleed, hemorrhagic stroke, stroke.  He was started on diltiazem gtt. per EDP.  He states that he took his amlodipine 5 mg prior to presentation for outpatient surgery ready.  Social history: lives with spouse, Connor Freeman. He denies smoking or using tobacco products. Occastnal etoh use, last drink was 3 twlve oz beers. He drinks 1-2 beers per week. He works [employer], running breakfast service.    Colonoscopy: every three years , his last colonoscopy was 1 year ago and had one polyp.  He gets a colonoscopy every 3 years due to his sister had colon cancer in her late 46s.  Vaccinations: patient fully vaccinated with three vaccines   ROS: Constitutional: no weight change, no fever ENT/Mouth: no sore throat, no rhinorrhea Eyes: no eye pain, no  vision changes Cardiovascular: no chest pain, no dyspnea,  no edema, no palpitations Respiratory: no cough, no sputum, no wheezing Gastrointestinal: no nausea, no vomiting, no diarrhea, no constipation Genitourinary: no urinary incontinence, no dysuria, no hematuria Musculoskeletal: no arthralgias, no myalgias Skin: no skin lesions, no pruritus, Neuro: no weakness, no loss of consciousness, no syncope Psych: no anxiety, no depression, no decrease appetite Heme/Lymph: no bruising, no bleeding  ED Course: Discussed with ED provider, patient requiring hospitalization for new atrial fibrillation with RVR.  Vitals in the emergency department was remarkable for temperature 98.1, respiration rate of 15, heart rate 112, blood pressure 139/95, SPO2 94% on room air.  Labs in the emergency department was remarkable for sodium 136, potassium 4.0, chloride 103, bicarb 22, BUN 20, serum creatinine of 0.87, nonfasting blood glucose 114, EGFR greater than 60, WBC 11.3, hemoglobin 16.6, platelets 232.  Initial troponin high-sensitivity is 4  Assessment/Plan  Principal Problem:   Rapid atrial fibrillation (HCC) Active Problems:   Essential hypertension   Hyperlipidemia, mixed   Diabetes mellitus type 2, noninsulin dependent (HCC)   Truncal obesity  New atrial fibrillation with rapid ventricular response - Cha2ds2-Vasc score is 2, hypertension and diabetes history - Continue Cardizem gtt - Complete echo ordered - We will check procalcitonin, UA, TSH, BNP - Admit to progressive cardiac, observation, with telemetry - Extensive discussion with patient and spouse at bedside regarding the risk and benefits of initiating anticoagulation including hemorrhagic stroke, GI bleed and the protection from ischemic stroke -We will monitor troponin HS - Eliquis 5 mg p.o. twice daily initiated - Patient endorses interest in fixing the A. fib and/or discontinuation of anticoagulation if possible and requests  cardiology evaluation - Cardiology, Dr. Garen Lah has been consulted via secure chat and via [place] page by myself. Murray Hodgkins has been added to secure chat page.  Hypertension-controlled, patient took his amlodipine 5 mg prior to outpatient presentation - Resumed amlodipine 5 mg daily starting 04/28/2021  Hyperlipidemia- currently taking statins - Last lipid panel showed total cholesterol of 145, triglycerides 72, HDL 47.9, LDL 83, VLDL 14, cholesterol/hyperlipidemia ratio 3.0 -Resumed rosuvastatin 40 mg nightly  NIDDM -managed with diet and exercise home -02/01/2021: Last A1c was 6.5  History of right rotator cuff tear- pain control, outpatient follow-up with orthopedic specialist - Acetaminophen 650 mg p.o. every 6 hours.  For mild pain, fever, headache, ketorolac 15 mg IV every 6 hours as needed for moderate pain  Prolonged QT-suspect secondary to new atrial fibrillation, treat as above, avoid QT prolonging agents  GERD-resumed with PPI on formulary  Chart reviewed.   DVT prophylaxis: Eliquis 5 mg p.o. twice daily, TED hose Code Status: Full code Diet: Heart healthy/carb modified Family Communication: Updated spouse, Connor Freeman at bedside Disposition Plan: Pending clinical course Consults called: Cardiology Admission status: Progressive cardiac, observation, with telemetry  Past Medical History:  Diagnosis Date  . Arthritis    hands and wrist  . Cancer ([place]) 1990   skin cancer 15 years throat and neck area   . GERD (gastroesophageal reflux disease)   . Hypertension   . Pre-diabetes    Past Surgical History:  Procedure Laterality Date  . excision on skin cancer   1990   Social History:  reports that he has never smoked. He has never used smokeless tobacco. He reports current alcohol use. He reports that he does not use drugs.  No Known Allergies No family history on file. Family history: Family history reviewed and not pertinent  Prior to Admission medications    Medication Sig Start Date End Date Taking? Authorizing Provider  acetaminophen (TYLENOL) 500 MG tablet Take 1,000 mg by mouth 2 (two) times daily as needed (pain/headaches).    [provider]  amLODipine (NORVASC) 5 MG tablet Take 5 mg by mouth in the morning.    [provider]  Cyanocobalamin (VITAMIN B-12) 5000 MCG SUBL Take 5,000 mcg by mouth in the morning.    [provider]  diclofenac Sodium (VOLTAREN) 1 % GEL Apply 1 application topically 4 (four) times daily as needed (arthritis pain (hands/wrists)).    [provider]  Melatonin 10 MG CAPS Take 10 mg by mouth at bedtime as needed (sleep).    [provider]  Multiple Vitamin (MULTIVITAMIN WITH MINERALS) TABS tablet Take 1 tablet by mouth in the morning. Centrum Silver    [provider]  omeprazole (PRILOSEC) 40 MG capsule Take 40 mg by mouth in the morning.    [provider]  rosuvastatin (CRESTOR) 40 MG tablet Take 40 mg by mouth in the morning.    [provider]   Physical Exam: Vitals:   04/27/21 1305 04/27/21 1330 04/27/21 1343 04/27/21 1400  BP:  (!) 128/106  102/73  Pulse:  (!) 46 (!) 114 69  Resp:  _0 Temp:      TempSrc:      SpO2:  98% 96% 96%  Weight: 124.7 kg     Height: _1  (1.854 m)      Constitutional: appears age-appropriate, NAD, calm, comfortable Eyes: PERRL, lids and conjunctivae normal ENMT: Mucous membranes are moist. Posterior pharynx clear of any exudate or lesions. Age-appropriate dentition.  Hearing appropriate Neck: normal, supple, no masses, no thyromegaly Respiratory: clear to auscultation bilaterally, no wheezing, no crackles. Normal respiratory effort. No accessory muscle use.  Cardiovascular: Regular rate and rhythm, no murmurs / rubs / gallops. No extremity edema. 2+ pedal pulses. No carotid bruits.  Abdomen: Obese abdomen, no tenderness, no masses palpated, no hepatosplenomegaly. Bowel sounds positive.   Musculoskeletal: no clubbing / cyanosis. No joint deformity upper and lower extremities. Good ROM, no contractures, no atrophy. Normal muscle tone.  Skin: no rashes, lesions, ulcers. No induration Neurologic: Sensation intact. Strength 5/5 in all 4.  Psychiatric: Normal judgment and insight. Alert and oriented x 3. Normal mood.   EKG: independently reviewed, showing atrial fibrillation with rate of 107, with rapid ventricular response, QTc 453  Second EKG done showed sinus tachycardia with rate of 148, QTc 525, right bundle branch block  Chest x-ray on Admission: I personally reviewed and I agree with radiologist reading as below.  DG Chest 2 View  Result Date: 04/27/2021 CLINICAL DATA:  Preoperative examination. Patient for shoulder surgery. EXAM: CHEST - 2 VIEW COMPARISON:  None. FINDINGS: Lungs are clear. Heart size is normal. No pneumothorax or pleural fluid. No acute or focal bony abnormality. IMPRESSION: No acute disease. Electronically Signed   By: Inge Rise M.D.   On: 04/27/2021 13:53   [place] OR NERVE BLOCK-IMAGE ONLY [facility])  Result Date: 04/27/2021 There is no interpretation for this exam.  This order is for images obtained during a surgical procedure.  Please See "Surgeries" Tab for more information regarding the procedure.   Labs on Admission: I have personally reviewed following labs  CBC: Recent Labs  Lab 04/21/21 1319 04/27/21 1309  WBC 8.4 11.3*  HGB 15.7 16.6  HCT 46.5 48.1  MCV 88.2 88.3  PLT 222 119   Basic Metabolic Panel: Recent Labs  Lab 04/27/21 1309  NA 136  K 4.0  CL 103  CO2 22  GLUCOSE 114*  BUN 20  CREATININE 0.87  CALCIUM 9.4  MG 2.1   GFR: Estimated Creatinine Clearance: 120.2 mL/min (by C-G formula based on SCr of 0.87 mg/dL).  CBG: Recent Labs  Lab 04/27/21 1132  GLUCAP 122*   Thyroid Function Tests: Recent Labs    04/27/21 1309  TSH 1.948   CRITICAL CARE Performed by: Briant Cedar Marise Knapper  Total critical care time: 40  minutes  Critical care time was exclusive of separately billable procedures and treating other patients.  Critical care was necessary to treat or prevent imminent or life-threatening deterioration. Heart failure, new atrial fibrillation  Critical care was time spent personally by me on the following activities: development of treatment plan with patient and/or surrogate as well as nursing, discussions with consultants, evaluation of patient's response to treatment, examination of patient, obtaining history from patient or surrogate, ordering and performing treatments and interventions, ordering and review of laboratory studies, ordering and review of radiographic studies, pulse oximetry and re-evaluation of patient's condition.  Pietra Zuluaga N Spring San D.O. [facility]  If 7PM-7AM, please contact overnight-coverage provider If 7AM-7PM, please contact day coverage provider [email or web address]  04/27/2021, 4:03 PM

## 2021-04-27 NOTE — Anesthesia Preprocedure Evaluation (Deleted)
Anesthesia Evaluation  Patient identified by MRN, date of birth, ID band Patient awake    Reviewed: Allergy & Precautions, NPO status , Patient's Chart, lab work & pertinent test results  Airway Mallampati: III  TM Distance: >3 FB Neck ROM: Full    Dental no notable dental hx. (+) Upper Dentures, Lower Dentures   Pulmonary neg pulmonary ROS,    Pulmonary exam normal        Cardiovascular Exercise Tolerance: Good hypertension, Pt. on medications negative cardio ROS Normal cardiovascular exam     Neuro/Psych negative neurological ROS  negative psych ROS   GI/Hepatic Neg liver ROS, GERD  Controlled and Medicated,  Endo/Other  diabetes, Well Controlled, Type 2, Oral Hypoglycemic Agents  Renal/GU negative Renal ROS  negative genitourinary   Musculoskeletal  (+) Arthritis , Osteoarthritis,    Abdominal (+) + obese,   Peds negative pediatric ROS (+)  Hematology negative hematology ROS (+)   Anesthesia Other Findings Pure hypercholesterolemia  . Controlled diabetes mellitus type 2 with complications (CMS-HCC)  . HTN, goal below 140/90  . Morbid obesity (CMS-HCC)  . Health care maintenance  . Traumatic complete tear of right rotator cuff  . Rotator cuff tendinitis, right  . Injury of tendon of long head of right biceps  . Generalized osteoarthritis of hand     Reproductive/Obstetrics negative OB ROS                            Anesthesia Physical Anesthesia Plan  ASA: III  Anesthesia Plan: MAC and Spinal   Post-op Pain Management: GA combined w/ Regional for post-op pain   Induction: Intravenous  PONV Risk Score and Plan: 2 and Ondansetron and Midazolam  Airway Management Planned: Oral ETT and Video Laryngoscope Planned  Additional Equipment:   Intra-op Plan:   Post-operative Plan:   Informed Consent: I have reviewed the patients History and Physical, chart, labs and discussed  the procedure including the risks, benefits and alternatives for the proposed anesthesia with the patient or authorized representative who has indicated his/her understanding and acceptance.       Plan Discussed with: CRNA, Anesthesiologist and Surgeon  Anesthesia Plan Comments: (Post Op ISB discussed including technical aspects and potential risks, including risk for nerve injury.  Pt understands and consents)        Anesthesia Quick Evaluation

## 2021-04-27 NOTE — Progress Notes (Signed)
While in pre-op pt heart rate noted to be 130-140.  ECG on chart showed hr 61.  ECG obtained in pre-op shows new onset a-fib.  Case cx. Pt to be evaluated by cardiology.

## 2021-04-27 NOTE — ED Notes (Signed)
Informed RN bed assigned 1505

## 2021-04-27 NOTE — ED Provider Notes (Signed)
[facility] Emergency Department Provider Note  ____________________________________________   Event Date/Time   First MD Initiated Contact with Patient 04/27/21 1315     (approximate)  I have reviewed the triage vital signs and the nursing notes.   HISTORY  Chief Complaint Tachycardia   HPI Connor Freeman is a 65 y.o. male a past medical history of arthritis, remote skin cancer, GERD, HTN and prediabetes as well as recent right shoulder injury scheduled to undergo right shoulder arthroscopic debridement with compression and repair of rotator cuff who presents from preop he was noted to have A. fib with rapid rate.  Patient has no history of A. fib.  He feels little anxious about surgery endorses some pain in his right shoulder but no other acute symptoms including headache, earache, sore throat, chest pain, cough, shortness of breath, vomiting, diarrhea, dysuria, rash or any other acute sick symptoms.         Past Medical History:  Diagnosis Date  . Arthritis    hands and wrist  . Cancer ([place]) 1990   skin cancer 15 years throat and neck area   . GERD (gastroesophageal reflux disease)   . Hypertension   . Pre-diabetes     Patient Active Problem List   Diagnosis Date Noted  . Rapid atrial fibrillation ([place]) 04/27/2021    Past Surgical History:  Procedure Laterality Date  . excision on skin cancer   1990    Prior to Admission medications   Medication Sig Start Date End Date Taking? Authorizing Provider  acetaminophen (TYLENOL) 500 MG tablet Take 1,000 mg by mouth 2 (two) times daily as needed (pain/headaches).   Yes [provider]  amLODipine (NORVASC) 5 MG tablet Take 5 mg by mouth in the morning.   Yes [provider]  Cyanocobalamin (VITAMIN B-12) 5000 MCG SUBL Take 5,000 mcg by mouth in the morning.   Yes [provider]  diclofenac Sodium (VOLTAREN) 1 % GEL Apply 1 application topically 4 (four) times daily as  needed (arthritis pain (hands/wrists)).   Yes [provider]  Melatonin 10 MG CAPS Take 10 mg by mouth at bedtime as needed (sleep).   Yes [provider]  Multiple Vitamin (MULTIVITAMIN WITH MINERALS) TABS tablet Take 1 tablet by mouth in the morning. Centrum Silver   Yes [provider]  omeprazole (PRILOSEC) 40 MG capsule Take 40 mg by mouth in the morning.   Yes [provider]  rosuvastatin (CRESTOR) 40 MG tablet Take 40 mg by mouth in the morning.   Yes [provider]    Allergies Patient has no known allergies.  No family history on file.  Social History Social History   Tobacco Use  . Smoking status: Never Smoker  . Smokeless tobacco: Never Used  Substance Use Topics  . Alcohol use: Yes    Comment: occasionally   . Drug use: Never    Review of Systems  Review of Systems  Constitutional: Negative for chills and fever.  HENT: Negative for sore throat.   Eyes: Negative for pain.  Respiratory: Negative for cough and stridor.   Cardiovascular: Negative for chest pain.  Gastrointestinal: Negative for vomiting.  Genitourinary: Negative for dysuria.  Musculoskeletal: Positive for joint pain ( R shoulder).  Skin: Negative for rash.  Neurological: Negative for seizures, loss of consciousness and headaches.  Psychiatric/Behavioral: Negative for suicidal ideas.  All other systems reviewed and are negative.     ____________________________________________   PHYSICAL EXAM:  VITAL SIGNS: ED Triage Vitals  Enc Vitals Group     BP 04/27/21 1304 130/76     Pulse Rate 04/27/21 1304 (!) 152     Resp 04/27/21 1304 18     Temp 04/27/21 1304 98.1 F (36.7 C)     Temp Source 04/27/21 1304 Oral     SpO2 04/27/21 1304 95 %     Weight 04/27/21 1305 275 lb (124.7 kg)     Height 04/27/21 1305 6\' 1"  (1.854 m)     Head Circumference --      Peak Flow --      Pain Score 04/27/21 1305 0     Pain Loc --      Pain Edu? --      Excl.  in [place]? --    Vitals:   04/27/21 1343 04/27/21 1400  BP:  102/73  Pulse: (!) 114 69  Resp: 20 20  Temp:    SpO2: 96% 96%   Physical Exam Vitals and nursing note reviewed.  Constitutional:      Appearance: He is well-developed. He is obese.  HENT:     Head: Normocephalic and atraumatic.     Right Ear: External ear normal.     Left Ear: External ear normal.     Nose: Nose normal.  Eyes:     Conjunctiva/sclera: Conjunctivae normal.  Cardiovascular:     Rate and Rhythm: Tachycardia present. Rhythm irregular.     Heart sounds: No murmur heard.   Pulmonary:     Effort: Pulmonary effort is normal. No respiratory distress.     Breath sounds: Normal breath sounds.  Abdominal:     Palpations: Abdomen is soft.     Tenderness: There is no abdominal tenderness.  Musculoskeletal:     Cervical back: Neck supple.  Skin:    General: Skin is warm and dry.     Capillary Refill: Capillary refill takes less than 2 seconds.  Neurological:     Mental Status: He is alert and oriented to person, place, and time.  Psychiatric:        Mood and Affect: Mood normal.      ____________________________________________   LABS (all labs ordered are listed, but only abnormal results are displayed)  Labs Reviewed  BASIC METABOLIC PANEL - Abnormal; Notable for the following components:      Result Value   Glucose, Bld 114 (*)    All other components within normal limits  CBC - Abnormal; Notable for the following components:   WBC 11.3 (*)    All other components within normal limits  RESP PANEL BY RT-PCR (FLU A&B, COVID) ARPGX2  MAGNESIUM  TSH  BRAIN NATRIURETIC PEPTIDE  HIV ANTIBODY (ROUTINE TESTING W REFLEX)  TROPONIN I (HIGH SENSITIVITY)  TROPONIN I (HIGH SENSITIVITY)   ____________________________________________  EKG  A. fib with a ventricular to 107, normal axis, unremarkable intervals, right bundle branch block without any other clearance of acute ischemia or significant  underlying arrhythmia. ____________________________________________  RADIOLOGY  ED MD interpretation: No focal consolidation, effusion, significant edema, pneumothorax or any other clear acute intrathoracic process.  Official radiology report(s): DG Chest 2 View  Result Date: 04/27/2021 CLINICAL DATA:  Preoperative examination. Patient for shoulder surgery. EXAM: CHEST - 2 VIEW COMPARISON:  None. FINDINGS: Lungs are clear. Heart size is normal. No pneumothorax or pleural fluid. No acute or focal bony abnormality. IMPRESSION: No acute disease. Electronically Signed   By: Inge Rise M.D.   On: 04/27/2021 13:53  [place] OR NERVE BLOCK-IMAGE ONLY [facility])  Result Date: 04/27/2021 There is no interpretation for this exam.  This order is for images obtained during a surgical procedure.  Please See "Surgeries" Tab for more information regarding the procedure.    ____________________________________________   PROCEDURES  Procedure(s) performed (including Critical Care):  .Critical Care Performed by: Lucrezia Starch, MD Authorized by: Lucrezia Starch, MD   Critical care provider statement:    Critical care time (minutes):  45   Critical care was necessary to treat or prevent imminent or life-threatening deterioration of the following conditions:  Cardiac failure   Critical care was time spent personally by me on the following activities:  Discussions with consultants, evaluation of patient's response to treatment, examination of patient, ordering and performing treatments and interventions, ordering and review of laboratory studies, ordering and review of radiographic studies, pulse oximetry, re-evaluation of patient's condition, obtaining history from patient or surrogate and review of old charts     ____________________________________________   INITIAL IMPRESSION / [place] / ED COURSE      Patient presents with above-stated history and exam after he was noted to  have A. fib with rapid ventricular response during his preop tracking today where he was initially scheduled to undergo right shoulder surgery.  He denies any significant associated symptoms other than some pain in his right shoulder.  On arrival he is tachycardic with an irregular rhythm with otherwise stable vital signs on room air.  Chest x-ray has no evidence of acute heart failure pneumonia pneumothorax or other clear acute thoracic process.  ECG has evidence of A. fib with a rate of 107, moderate patient is intermittently increasing up to the 130s.  Patient denies any chest pain and troponin is 4 despite some nonspecific changes on ECG I have a low suspicion or ACS.  CBC has mild leukocytosis which is somewhat nonspecific but normal hemoglobin and platelets.  BMP is unremarkable.  Magnesium is unremarkable.  COVID and flu is negative.  Given RVR with rates above 110 we will start patient on diltiazem drip and admit to medicine for further evaluation management.        ____________________________________________   FINAL CLINICAL IMPRESSION(S) / ED DIAGNOSES  Final diagnoses:  Atrial fibrillation with RVR (HCC)    Medications  diltiazem (CARDIZEM) 1 mg/mL load via infusion 10 mg (10 mg Intravenous Bolus from Bag 04/27/21 1513)    And  diltiazem (CARDIZEM) 125 mg in dextrose 5% 125 mL (1 mg/mL) infusion (5 mg/hr Intravenous New Bag/Given 04/27/21 1511)  rosuvastatin (CRESTOR) tablet 40 mg (has no administration in time range)  pantoprazole (PROTONIX) EC tablet 80 mg (has no administration in time range)  amLODipine (NORVASC) tablet 5 mg (has no administration in time range)  Vitamin B-12 SUBL 5,000 mcg (has no administration in time range)  melatonin tablet 10 mg (has no administration in time range)  multivitamin with minerals tablet 1 tablet (has no administration in time range)  acetaminophen (TYLENOL) tablet 650 mg (has no administration in time range)    Or  acetaminophen  (TYLENOL) suppository 650 mg (has no administration in time range)  ondansetron (ZOFRAN) tablet 4 mg (has no administration in time range)    Or  ondansetron (ZOFRAN) injection 4 mg (has no administration in time range)     ED Discharge Orders    None       Note:  This document was prepared using Dragon voice recognition software and may include unintentional dictation  errors.   Lucrezia Starch, MD 04/27/21 1538

## 2021-04-27 NOTE — OR Nursing (Signed)
While connecting patient to EKG monitor he was noted to be in a rapid rate of 140 BPM. Patient denies rapid hr in the past and he reports he can not feel that it is fast. Dr. Nira Retort. From anesthesia notifed. EKG done and awaiting him to review. EKG reviewed by Dr. Nira Retort. He has cancelled surgery for today and would like patient to go to ED to be seen by a cardiologist. Patient denies any symptoms if dizziness, CP or other complaint. HR is 118-135 a fib. BP is stable at 146/76. IV converted to a med lock and patient transported to ED for evaluation.

## 2021-04-27 NOTE — H&P (Signed)
History of Present Illness:  Connor Freeman is a 65 y.o. male that presents to clinic today for his preoperative history and evaluation. The patient is scheduled to undergo a right shoulder arthroscopy with debridement, decompression, and repair of his rotator cuff tears on 04/27/21 by Dr. Roland Rack. The patient reports a 68-month history of significant right shoulder pain and weakness after sustaining an injury while starting his lawnmower. Pain is described as moderate as well as aching, exhausting, miserable, nagging, stabbing and throbbing. Pain is localized to the lateral arm and shoulder. Symptoms are worsened with normal everyday activities as well as sleeping, carrying heavy objects, at higher levels of activity, and with overhead activity.  The patient's symptoms have progressed to the point that they decrease his quality of life. The patient has previously undergone conservative treatment including NSAIDS and activity modification without adequate control of his symptoms.  Patient has received all necessary clearances for surgery. Of note patient is a diabetic with last A1c being 6.5 on 02/01/2021  Past Medical History:  . Controlled type 2 diabetes mellitus without complication (CMS-HCC) 2/70/6237  . HTN, goal below 140/90 09/17/2015  . Obesity, Class II, BMI 35.0-39.9, with comorbidity (see actual BMI) 09/17/2015  . Pure hypercholesterolemia 09/17/2015   Past Surgical History:  . COLONOSCOPY 10/06/2003 (Adenomatous Polyp, FHCC (Sister), FH Colon Polyps (Brother): [facility] 09/2007)  . COLONOSCOPY 12/01/2015 (Adenomatous Polyp, FHCC (Sister), FH Colon Polyps (Brother): [facility] 11/2020)  . EGD 12/01/2015 (No repeat per RTE)  . melanoma    Current Medications:  . acetaminophen (TYLENOL) 500 MG tablet Take 500 mg by mouth as needed for Pain  . albuterol 90 mcg/actuation inhaler Inhale 2 inhalations into the lungs every 6 (six) hours as needed for Wheezing 18 g 0  . amLODIPine (NORVASC) 5 MG tablet Take 1  tablet (5 mg total) by mouth once daily 90 tablet 3  . cyanocobalamin, vitamin B-12, 5,000 mcg Subl Take 5,000 mcg by mouth once daily  . diclofenac (VOLTAREN) 1 % topical gel Apply 4 g topically 4 (four) times daily 300 g 11  . HYDROcodone-acetaminophen (NORCO) 5-325 mg tablet Take one tablet at night for pain; may take up to every 6 hours as needed for pain if not working or driving 20 tablet 0  . ibuprofen (MOTRIN) 200 MG tablet Take 400 mg by mouth as needed for Pain  . melatonin 10 mg Cap Take 10 mg by mouth nightly as needed  . multivitamin capsule Take 1 capsule by mouth once daily.  Marland Kitchen omeprazole (PRILOSEC) 40 MG DR capsule Take 1 capsule (40 mg total) by mouth once daily 90 capsule 3  . rosuvastatin (CRESTOR) 40 MG tablet Take 1 tablet (40 mg total) by mouth once daily 90 tablet 3  . valsartan-hydrochlorothiazide (DIOVAN-HCT) 320-25 mg tablet Take 1 tablet by mouth once daily 90 tablet 3   Allergies:  No Known Allergies  Social History:   Socioeconomic History:  Marland Kitchen Marital status: Married  . Number of children: 3  Occupational History  . Occupation: Surveyor, mining  Tobacco Use  . Smoking status: Never Smoker  . Smokeless tobacco: Never Used  Vaping Use  . Vaping Use: Never used  Substance and Sexual Activity  . Alcohol use: Yes  Alcohol/week: 2.0 standard drinks  Types: 2 Glasses of wine per week   Family History:  . Stroke Mother  . Coronary Artery Disease (Blocked arteries around heart) Father  . Cancer Sister  . Throat cancer Brother  . Coronary Artery Disease (  Blocked arteries around heart) Brother   Review of Systems:  A 10+ ROS was performed, reviewed, and the pertinent orthopaedic findings are documented in the HPI.   Physical Examination:  BP (!) 124/90 (BP Location: Left upper arm, Patient Position: Sitting, BP Cuff Size: Large Adult)  Ht 185.4 cm (6\' 1" )  Wt (!) 123.7 kg (272 lb 9.6 oz)  BMI 35.97 kg/m   Patient is a well-developed,  well-nourished male in no acute distress. Patient has normal mood and affect. Patient is alert and oriented to person, place, and time.   HEENT: Atraumatic, normocephalic. Pupils equal and reactive to light. Extraocular motion intact. Noninjected sclera.  Cardiovascular: Regular rate and rhythm, with no murmurs, rubs, or gallops. Radial pulse 2+  Respiratory: Lungs clear to auscultation bilaterally.   Right shoulder exam: SKIN: normal SWELLING: none WARMTH: none LYMPH NODES: no adenopathy palpable CREPITUS: none TENDERNESS: Mildly tender over anterolateral shoulder ROM (active):  Forward flexion: 160 degrees Abduction: 155 degrees Internal rotation: L3 ROM (passive):  Forward flexion: 165 degrees Abduction: 160 degrees  ER/IR at 90 abd: 90 degrees / 50 degrees  He has moderate pain with forward flexion, abduction, internal rotation, and internal rotation at 90 degrees of abduction, especially as he descends through 90 degrees of forward flexion and abduction.  STRENGTH: Forward flexion: 4-4+/5 Abduction: 4/5 External rotation: 4-4+/5 Internal rotation: 4+/5 Pain with RC testing: Mild pain with resisted abduction more so than with resisted forward flexion  STABILITY: Normal  SPECIAL TESTS: Luan Pulling' test: positive, moderate Speed's test: Mildly positive Capsulitis - pain w/ passive ER: no Crossed arm test: Mildly positive Crank: Not evaluated Anterior apprehension: Negative Posterior apprehension: Not evaluated  Sensation is intact over the median, radial, ulnar, and axillary nerve distributions. Patient able to make an OK sign, thumbs up, and criss-cross the 2nd and 3rd digits.   Right Shoulder MRI: MRI Shoulder Cartilage: No cartilage abnormality. MRI Shoulder Rotator Cuff: Full-thickness tear of the supraspinatus tendon, possibly extending into the anterior fibers of the infraspinatus tendon. Retracted to the humeral head. Full-thickness tear of the anterior  insertional fibers of the subscapularis tendon. MRI Shoulder Labrum / Biceps: Torn and retracted long head of biceps tendon. MRI Shoulder Bone: Normal bone.  Impression:  1. Traumatic complete tear of right rotator cuff.  2. Injury of tendon of long head of right biceps.   Plan:  The treatment options were discussed with the patient. In addition, patient educational materials were provided regarding the diagnosis and treatment options. Overall, the patient is quite frustrated by his symptoms and functional limitations, and is ready to consider more aggressive treatment options. Therefore, I have recommended a surgical procedure, specifically a right shoulder arthroscopy with debridement, decompression, and repair of his rotator cuff tears. The procedure was discussed with the patient, as were the potential risks (including bleeding, infection, nerve and/or blood vessel injury, persistent or recurrent pain, failure of the repair, progression of arthritis, need for further surgery, blood clots, strokes, heart attacks and/or arhythmias, pneumonia, etc.) and benefits. The patient states his understanding and wishes to proceed. All of the patient's questions and concerns were answered. He can call any time with further concerns. He will follow up post-surgery, routine.    H&P reviewed and patient re-examined. No changes.

## 2021-04-27 NOTE — ED Triage Notes (Signed)
Pt here for shoulder surgery, pre-surgical EKG showed afib RVR rate 120-140's. Pt states he feels fine, other than a little nervous for surgery. Pt alert and oriented X4, cooperative, RR even and unlabored, color WNL. Pt in NAD. No hx of afib per pt

## 2021-04-27 NOTE — ED Notes (Signed)
Called lab to add on TSH and BMP to previously sent bloodwork. Per lab, will add on.

## 2021-04-28 ENCOUNTER — Encounter: Payer: Self-pay | Admitting: Internal Medicine

## 2021-04-28 ENCOUNTER — Telehealth: Payer: Self-pay | Admitting: Cardiology

## 2021-04-28 DIAGNOSIS — E1169 Type 2 diabetes mellitus with other specified complication: Secondary | ICD-10-CM

## 2021-04-28 DIAGNOSIS — E669 Obesity, unspecified: Secondary | ICD-10-CM

## 2021-04-28 DIAGNOSIS — M75101 Unspecified rotator cuff tear or rupture of right shoulder, not specified as traumatic: Secondary | ICD-10-CM

## 2021-04-28 DIAGNOSIS — R9431 Abnormal electrocardiogram [ECG] [EKG]: Secondary | ICD-10-CM

## 2021-04-28 DIAGNOSIS — I480 Paroxysmal atrial fibrillation: Secondary | ICD-10-CM

## 2021-04-28 DIAGNOSIS — I4891 Unspecified atrial fibrillation: Secondary | ICD-10-CM | POA: Diagnosis not present

## 2021-04-28 DIAGNOSIS — I10 Essential (primary) hypertension: Secondary | ICD-10-CM

## 2021-04-28 DIAGNOSIS — E785 Hyperlipidemia, unspecified: Secondary | ICD-10-CM

## 2021-04-28 DIAGNOSIS — R0683 Snoring: Secondary | ICD-10-CM

## 2021-04-28 LAB — URINALYSIS, COMPLETE (UACMP) WITH MICROSCOPIC
Bacteria, UA: NONE SEEN
Bilirubin Urine: NEGATIVE
Glucose, UA: NEGATIVE mg/dL
Hgb urine dipstick: NEGATIVE
Ketones, ur: NEGATIVE mg/dL
Leukocytes,Ua: NEGATIVE
Nitrite: NEGATIVE
Protein, ur: NEGATIVE mg/dL
Specific Gravity, Urine: 1.02 (ref 1.005–1.030)
Squamous Epithelial / HPF: NONE SEEN (ref 0–5)
pH: 6 (ref 5.0–8.0)

## 2021-04-28 LAB — CBC
HCT: 47 % (ref 39.0–52.0)
Hemoglobin: 15.8 g/dL (ref 13.0–17.0)
MCH: 30.3 pg (ref 26.0–34.0)
MCHC: 33.6 g/dL (ref 30.0–36.0)
MCV: 90 fL (ref 80.0–100.0)
Platelets: 208 10*3/uL (ref 150–400)
RBC: 5.22 MIL/uL (ref 4.22–5.81)
RDW: 13.8 % (ref 11.5–15.5)
WBC: 7.6 10*3/uL (ref 4.0–10.5)
nRBC: 0 % (ref 0.0–0.2)

## 2021-04-28 LAB — ECHOCARDIOGRAM COMPLETE
Height: 73 in
S' Lateral: 2.7 cm
Weight: 4225.78 oz

## 2021-04-28 LAB — BASIC METABOLIC PANEL
Anion gap: 8 (ref 5–15)
BUN: 19 mg/dL (ref 8–23)
CO2: 25 mmol/L (ref 22–32)
Calcium: 9.2 mg/dL (ref 8.9–10.3)
Chloride: 104 mmol/L (ref 98–111)
Creatinine, Ser: 0.84 mg/dL (ref 0.61–1.24)
GFR, Estimated: >60 mL/min (ref >60)
Glucose, Bld: 139 mg/dL — ABNORMAL HIGH (ref 70–99)
Potassium: 3.9 mmol/L (ref 3.5–5.1)
Sodium: 137 mmol/L (ref 135–145)

## 2021-04-28 LAB — HEMOGLOBIN A1C
Hgb A1c MFr Bld: 6.3 % — ABNORMAL HIGH (ref 4.8–5.6)
Mean Plasma Glucose: 134.11 mg/dL

## 2021-04-28 LAB — GLUCOSE, CAPILLARY
Glucose-Capillary: 141 mg/dL — ABNORMAL HIGH (ref 70–99)
Glucose-Capillary: 183 mg/dL — ABNORMAL HIGH (ref 70–99)

## 2021-04-28 MED ORDER — DILTIAZEM HCL ER COATED BEADS 180 MG PO CP24: 180.0000 mg | ORAL_CAPSULE | Freq: Every day | ORAL | Status: DC

## 2021-04-28 MED ORDER — DILTIAZEM HCL ER COATED BEADS 120 MG PO CP24
240.0000 mg | ORAL_CAPSULE | Freq: Every day | ORAL | Status: DC
  Administered 2021-04-28: 240 mg via ORAL
  Filled 2021-04-28: qty 2

## 2021-04-28 MED ORDER — DILTIAZEM HCL 30 MG PO TABS
60.0000 mg | ORAL_TABLET | Freq: Four times a day (QID) | ORAL | Status: DC
  Administered 2021-04-28: 60 mg via ORAL
  Filled 2021-04-28: qty 2

## 2021-04-28 MED ORDER — DILTIAZEM HCL ER COATED BEADS 240 MG PO CP24: 240.0000 mg | ORAL_CAPSULE | Freq: Every day | ORAL | 0 refills | Status: DC

## 2021-04-28 MED ORDER — APIXABAN 5 MG PO TABS: 5.0000 mg | ORAL_TABLET | Freq: Two times a day (BID) | ORAL | 0 refills | Status: DC

## 2021-04-28 NOTE — Discharge Instructions (Signed)

## 2021-04-28 NOTE — Telephone Encounter (Signed)
-----   Message from Theora Gianotti, NP sent at 04/28/2021  9:42 AM EDT ----- Good morning,  Would you pls arrange outpt f/u for this pt in ~ 2-3 wks w/ either [facility] or me?  He'll need a cardioversion, so wouldn't want to wait any longer than 3 wks.  Thanks,  Gerald Stabs

## 2021-04-28 NOTE — Discharge Summary (Signed)
[facility] NAME: Connor Freeman    MR#:  [id number]  DATE OF BIRTH:  November 28, 1956  DATE OF ADMISSION:  04/27/2021 ADMITTING PHYSICIAN: Amy N Cox, DO  DATE OF DISCHARGE: 04/28/2021  PRIMARY CARE PHYSICIAN: Kirk Ruths, MD    ADMISSION DIAGNOSIS:  Rapid atrial fibrillation (HCC) [I48.91] Atrial fibrillation with RVR ([place]) [I48.91]  DISCHARGE DIAGNOSIS:  Principal Problem:   Rapid atrial fibrillation (HCC) Active Problems:   Essential hypertension   Hyperlipidemia, mixed   Diabetes mellitus type 2, noninsulin dependent (HCC)   Truncal obesity   Atrial fibrillation with RVR ([place])   SECONDARY DIAGNOSIS:   Past Medical History:  Diagnosis Date  . Arthritis    hands and wrist  . Cancer ([place]) 1990   skin cancer 15 years throat and neck area   . GERD (gastroesophageal reflux disease)   . Hyperlipidemia   . Hypertension   . Obesity   . RBBB   . Snores   . Type II diabetes mellitus ([place])     HOSPITAL COURSE:   1.  Paroxysmal atrial fibrillation.  Patient was here yesterday for elective shoulder surgery which was canceled secondary to atrial fibrillation.  He was admitted and started on a Cardizem drip.  Short acting Cardizem was added this morning and he was converted off the Cardizem drip.  Patient ambulated around the nursing station and felt fine without any symptoms.  Cardizem CD 240 mg will be started and patient will be discharged home.  Eliquis for anticoagulation secondary to CHA2DS2-VASc score of 2.  Benefits and risks of blood thinner explained to patient. 2.  Essential hypertension discontinue Norvasc and use Cardizem CD instead. 3.  Type 2 diabetes mellitus with hyperlipidemia unspecified on Crestor.  Patient on diet control. 4.  Prolonged QT.  Likely with fast heart rate.  Recheck EKG again as outpatient. 5.  Obesity with a BMI of 34.7 with snoring.  Recommend outpatient sleep study. 6.  Right shoulder rotator  cuff tear.  Follow-up as outpatient with orthopedic surgery and will have to reschedule surgery again as outpatient.  DISCHARGE CONDITIONS:   Satisfactory  CONSULTS OBTAINED:  Treatment Team:  Kate Sable, MD  DRUG ALLERGIES:  No Known Allergies  DISCHARGE MEDICATIONS:   Allergies as of 04/28/2021   No Known Allergies     Medication List    STOP taking these medications   amLODipine 5 MG tablet Commonly known as: NORVASC     TAKE these medications   acetaminophen 500 MG tablet Commonly known as: TYLENOL Take 1,000 mg by mouth 2 (two) times daily as needed (pain/headaches).   apixaban 5 MG Tabs tablet Commonly known as: ELIQUIS Take 1 tablet (5 mg total) by mouth 2 (two) times daily.   diclofenac Sodium 1 % Gel Commonly known as: VOLTAREN Apply 1 application topically 4 (four) times daily as needed (arthritis pain (hands/wrists)).   diltiazem 240 MG 24 hr capsule Commonly known as: CARDIZEM CD Take 1 capsule (240 mg total) by mouth daily. Start taking on: Apr 29, 2021   Melatonin 10 MG Caps Take 10 mg by mouth at bedtime as needed (sleep).   multivitamin with minerals Tabs tablet Take 1 tablet by mouth in the morning. Centrum Silver   omeprazole 40 MG capsule Commonly known as: PRILOSEC Take 40 mg by mouth in the morning.   rosuvastatin 40 MG tablet Commonly known as: CRESTOR Take 40 mg by mouth in the morning.  Vitamin B-12 5000 MCG Subl Take 5,000 mcg by mouth in the morning.        DISCHARGE INSTRUCTIONS:   Follow-up PMD 5 days Follow-up cardiology 2 weeks  If you experience worsening of your admission symptoms, develop shortness of breath, life threatening emergency, suicidal or homicidal thoughts you must seek medical attention immediately by calling 911 or calling your MD immediately  if symptoms less severe.  You Must read complete instructions/literature along with all the possible adverse reactions/side effects for all the  Medicines you take and that have been prescribed to you. Take any new Medicines after you have completely understood and accept all the possible adverse reactions/side effects.   Please note  You were cared for by a hospitalist during your hospital stay. If you have any questions about your discharge medications or the care you received while you were in the hospital after you are discharged, you can call the unit and asked to speak with the hospitalist on call if the hospitalist that took care of you is not available. Once you are discharged, your primary care physician will handle any further medical issues. Please note that NO REFILLS for any discharge medications will be authorized once you are discharged, as it is imperative that you return to your primary care physician (or establish a relationship with a primary care physician if you do not have one) for your aftercare needs so that they can reassess your need for medications and monitor your lab values.    Today   CHIEF COMPLAINT:   Chief Complaint  Patient presents with  . Tachycardia    HISTORY OF PRESENT ILLNESS:  Connor Freeman  is a 65 y.o. male here for elective right shoulder surgery was found to be in atrial fibrillation   VITAL SIGNS:  Blood pressure 114/73, pulse 74, temperature 98.3 F (36.8 C), temperature source Oral, resp. rate 18, height 6\' 1"  (1.854 m), weight 119.6 kg, SpO2 94 %.  I/O:  No intake or output data in the 24 hours ending 04/28/21 1715  PHYSICAL EXAMINATION:  GENERAL:  65 y.o.-year-old patient lying in the bed with no acute distress.  EYES: Pupils equal, round, reactive to light and accommodation. No scleral icterus. Extraocular muscles intact.  HEENT: Head atraumatic, normocephalic. Oropharynx and nasopharynx clear.  LUNGS: Normal breath sounds bilaterally, no wheezing, rales,rhonchi or crepitation. No use of accessory muscles of respiration.  CARDIOVASCULAR: S1, S2 irregularly irregular. No murmurs,  rubs, or gallops.  ABDOMEN: Soft, non-tender, non-distended. Bowel sounds present. No organomegaly or mass.  EXTREMITIES: No pedal edema.  NEUROLOGIC: Cranial nerves II through XII are intact. Muscle strength 5/5 in all extremities. Sensation intact. Gait not checked.  PSYCHIATRIC: The patient is alert and oriented x 3.  SKIN: No obvious rash, lesion, or ulcer.   DATA REVIEW:   CBC Recent Labs  Lab 04/28/21 0518  WBC 7.6  HGB 15.8  HCT 47.0  PLT 208    Chemistries  Recent Labs  Lab 04/27/21 1309 04/28/21 0518  NA 136 137  K 4.0 3.9  CL 103 104  CO2 22 25  GLUCOSE 114* 139*  BUN 20 19  CREATININE 0.87 0.84  CALCIUM 9.4 9.2  MG 2.1  --     Microbiology Results  Results for orders placed or performed during the hospital encounter of 04/27/21  Resp Panel by RT-PCR (Flu A&B, Covid) Nasopharyngeal Swab     Status: None   Collection Time: 04/27/21  1:40 PM   Specimen: Nasopharyngeal  Swab; Nasopharyngeal(NP) swabs in vial transport medium  Result Value Ref Range Status   SARS Coronavirus 2 by RT PCR NEGATIVE NEGATIVE Final    Comment: (NOTE) SARS-CoV-2 target nucleic acids are NOT DETECTED.  The SARS-CoV-2 RNA is generally detectable in upper respiratory specimens during the acute phase of infection. The lowest concentration of SARS-CoV-2 viral copies this assay can detect is 138 copies/mL. A negative result does not preclude SARS-Cov-2 infection and should not be used as the sole basis for treatment or other patient management decisions. A negative result may occur with  improper specimen collection/handling, submission of specimen other than nasopharyngeal swab, presence of viral mutation(s) within the areas targeted by this assay, and inadequate number of viral copies(<138 copies/mL). A negative result must be combined with clinical observations, patient history, and epidemiological information. The expected result is Negative.  Fact Sheet for Patients:   EntrepreneurPulse.com.au  Fact Sheet for Healthcare Providers:  IncredibleEmployment.be  This test is no t yet approved or cleared by the [place] FDA and  has been authorized for detection and/or diagnosis of SARS-CoV-2 by FDA under an Emergency Use Authorization (EUA). This EUA will remain  in effect (meaning this test can be used) for the duration of the COVID-19 declaration under Section 564(b)(1) of the Act, 21 U.S.C.section 360bbb-3(b)(1), unless the authorization is terminated  or revoked sooner.       Influenza A by PCR NEGATIVE NEGATIVE Final   Influenza B by PCR NEGATIVE NEGATIVE Final    Comment: (NOTE) The Xpert Xpress SARS-CoV-2/FLU/RSV plus assay is intended as an aid in the diagnosis of influenza from Nasopharyngeal swab specimens and should not be used as a sole basis for treatment. Nasal washings and aspirates are unacceptable for Xpert Xpress SARS-CoV-2/FLU/RSV testing.  Fact Sheet for Patients: EntrepreneurPulse.com.au  Fact Sheet for Healthcare Providers: IncredibleEmployment.be  This test is not yet approved or cleared by the [place] FDA and has been authorized for detection and/or diagnosis of SARS-CoV-2 by FDA under an Emergency Use Authorization (EUA). This EUA will remain in effect (meaning this test can be used) for the duration of the COVID-19 declaration under Section 564(b)(1) of the Act, 21 U.S.C. section 360bbb-3(b)(1), unless the authorization is terminated or revoked.  Performed at [facility], [address] [place], [place] [address]     RADIOLOGY:  DG Chest 2 View  Result Date: 04/27/2021 CLINICAL DATA:  Preoperative examination. Patient for shoulder surgery. EXAM: CHEST - 2 VIEW COMPARISON:  None. FINDINGS: Lungs are clear. Heart size is normal. No pneumothorax or pleural fluid. No acute or focal bony abnormality. IMPRESSION: No acute  disease. Electronically Signed   By: Inge Rise M.D.   On: 04/27/2021 13:53   [place] OR NERVE BLOCK-IMAGE ONLY [facility])  Result Date: 04/27/2021 There is no interpretation for this exam.  This order is for images obtained during a surgical procedure.  Please See "Surgeries" Tab for more information regarding the procedure.   ECHOCARDIOGRAM COMPLETE  Result Date: 04/28/2021    ECHOCARDIOGRAM REPORT   Patient Name:   TRERON PROVENCHER Date of Exam: 04/27/2021 Medical Rec #:  [id number]    Height:       73.0 in Accession #:    [id number]   Weight:       264.1 lb Date of Birth:  05/02/1956    BSA:          2.422 m Patient Age:    65 years     BP:  102/73 mmHg Patient Gender: M            HR:           90 bpm. Exam Location:  [facility] Procedure: 2D Echo, Cardiac Doppler, Color Doppler and Intracardiac            Opacification Agent Indications:     I48.91 Atrial Fibrillation  History:         Patient has no prior history of Echocardiogram examinations.                  Risk Factors:Hypertension and Prediabetes.  Sonographer:     Wilford Sports Rodgers-Jones Referring Phys:  [id number] AMY N COX Diagnosing Phys: Kate Sable MD IMPRESSIONS  1. Left ventricular ejection fraction, by estimation, is 60 to 65%. The left ventricle has normal function. The left ventricle has no regional wall motion abnormalities. There is mild left ventricular hypertrophy. Left ventricular diastolic parameters are indeterminate.  2. Right ventricular systolic function is normal. The right ventricular size is normal.  3. The mitral valve is normal in structure. No evidence of mitral valve regurgitation.  4. The aortic valve is tricuspid. Aortic valve regurgitation is not visualized.  5. The inferior vena cava is normal in size with greater than 50% respiratory variability, suggesting right atrial pressure of 3 mmHg. FINDINGS  Left Ventricle: Left ventricular ejection fraction, by estimation, is 60 to 65%. The left ventricle has normal  function. The left ventricle has no regional wall motion abnormalities. Definity contrast agent was given IV to delineate the left ventricular  endocardial borders. The left ventricular internal cavity size was normal in size. There is mild left ventricular hypertrophy. Left ventricular diastolic parameters are indeterminate. Right Ventricle: The right ventricular size is normal. No increase in right ventricular wall thickness. Right ventricular systolic function is normal. Left Atrium: Left atrial size was normal in size. Right Atrium: Right atrial size was normal in size. Pericardium: There is no evidence of pericardial effusion. Mitral Valve: The mitral valve is normal in structure. No evidence of mitral valve regurgitation. Tricuspid Valve: The tricuspid valve is normal in structure. Tricuspid valve regurgitation is not demonstrated. Aortic Valve: The aortic valve is tricuspid. Aortic valve regurgitation is not visualized. Pulmonic Valve: The pulmonic valve was normal in structure. Pulmonic valve regurgitation is not visualized. Aorta: The aortic root and ascending aorta are structurally normal, with no evidence of dilitation. Venous: The inferior vena cava is normal in size with greater than 50% respiratory variability, suggesting right atrial pressure of 3 mmHg. IAS/Shunts: No atrial level shunt detected by color flow Doppler.  LEFT VENTRICLE PLAX 2D LVIDd:         4.40 cm LVIDs:         2.70 cm LV PW:         1.10 cm LV IVS:        1.10 cm LVOT diam:     2.10 cm LV SV:         57 LV SV Index:   23 LVOT Area:     3.46 cm  RIGHT VENTRICLE             IVC RV Basal diam:  4.80 cm     IVC diam: 1.10 cm RV S prime:     17.17 cm/s TAPSE (M-mode): 2.1 cm LEFT ATRIUM             Index       RIGHT ATRIUM  Index LA diam:        4.30 cm 1.78 cm/m  RA Area:     19.90 cm LA Vol (A2C):   52.5 ml 21.68 ml/m RA Volume:   66.40 ml  27.42 ml/m LA Vol (A4C):   43.6 ml 18.01 ml/m LA Biplane Vol: 48.7 ml 20.11 ml/m   AORTIC VALVE LVOT Vmax:   110.00 cm/s LVOT Vmean:  75.667 cm/s LVOT VTI:    0.163 m  AORTA Ao Root diam: 3.70 cm Ao Asc diam:  3.50 cm MV E velocity: 76.28 cm/s                            SHUNTS                            Systemic VTI:  0.16 m                            Systemic Diam: 2.10 cm Kate Sable MD Electronically signed by Kate Sable MD Signature Date/Time: 04/28/2021/10:03:28 AM    Final     Management plans discussed with the patient, family and they are in agreement.  CODE STATUS:     Code Status Orders  (From admission, onward)         Start     Ordered   04/27/21 1454  Full code  Continuous        04/27/21 1455        Code Status History    This patient has a current code status but no historical code status.   Advance Care Planning Activity      TOTAL TIME TAKING CARE OF THIS PATIENT: 35 minutes.    Loletha Grayer M.D on 04/28/2021 at 5:15 PM  Between 7am to 6pm - Pager - [phone]  After 6pm go to [email or web address] - password [email or web address]  [facility]  CC: Primary care physician; Kirk Ruths, MD

## 2021-04-28 NOTE — Progress Notes (Signed)
Ambulated around nursing station / tolerated well/ Discharge instructions explained to pt and pts spouse/ verbalized an understanding/ ivs and tele removed/ eliquis coupon given to pt/ transported off unit via wheelchair.

## 2021-04-28 NOTE — Telephone Encounter (Signed)
Per Epic currently admitted

## 2021-04-28 NOTE — Consult Note (Addendum)
Cardiology Consult    Patient ID: Connor Freeman MRN: [id number], DOB/AGE: Sep 03, 1956   Admit date: 04/27/2021 Date of Consult: 04/28/2021  Primary Physician: Kirk Ruths, MD Primary Cardiologist: Kate Sable, MD Requesting Provider: R. Leslye Peer, MD  Patient Profile    Connor Freeman is a 65 y.o. male with a history of HTN, HL, obesity, DMII, OA, snoring, and GERD, who is being seen today for the evaluation of atrial fibrillation at the request of Dr. Leslye Peer.  Past Medical History   Past Medical History:  Diagnosis Date  . Arthritis    hands and wrist  . Cancer ([place]) 1990   skin cancer 15 years throat and neck area   . GERD (gastroesophageal reflux disease)   . Hyperlipidemia   . Hypertension   . Obesity   . RBBB   . Snores   . Type II diabetes mellitus ([place])     Past Surgical History:  Procedure Laterality Date  . excision on skin cancer   1990     Allergies  No Known Allergies  History of Present Illness    65 y/o ? w/ a h/o HTN, HL, DMII, obesity, snoring (never had sleep eval), GERD, OA, and skin cancer.  He denies any prior cardiac history or FH of premature CAD.  He lives locally w/ his wife after moving from [place], [place] in 1995.  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  He has no prior h/o chest pain or DOE.  In 2021, he developed R shoulder pain after trying to start his lawn mower and has since been followed by ortho in the setting of a traumatic complete tear of the right rotator cuff w/ injury of the tendon of the long head of the right biceps.  Plan was for surgery on 5/10.  Pt presented for preop eval on 5/4 w/ ECG showing sinus rhythm w/ RBBB.   Pt presented for R shoulder surgery on 5/10 and when he was attached to the monitor, he was noted to be in Afib w/ RVR, initially in the 120's, though rates did rise into the 140's.  He was completely asymptomatic.   Surgery was cancelled and he was referred to the ED where he was placed on dilt gtt.  Labs unremarkable and HsTrops nl.  He was admitted and placed on eliquis.  He remains in rate-controlled afib this AM on dilt gtt @ 12.5mg /hr.  He remains asymptomatic.  Inpatient Medications    . apixaban  5 mg Oral BID  . diltiazem  60 mg Oral Q6H  . insulin aspart  0-5 Units Subcutaneous QHS  . insulin aspart  0-9 Units Subcutaneous TID WC  . multivitamin with minerals  1 tablet Oral q AM  . pantoprazole  80 mg Oral QAC breakfast  . rosuvastatin  40 mg Oral QHS  . vitamin B-12  5,000 mcg Oral Daily    Family History    Family History  Problem Relation Age of Onset  . Stroke Mother        died in her 90's  . Heart attack Father        died in his 39's  . Stroke Brother   . Heart defect Brother        s/p valve replacement in his 40s   He indicated that his mother is deceased. He indicated that his father is deceased. He  indicated that both of his brothers are alive.   Social History    Social History   Socioeconomic History  . Marital status: Married    Spouse name: Not on file  . Number of children: Not on file  . Years of education: Not on file  . Highest education level: Not on file  Occupational History  . Not on file  Tobacco Use  . Smoking status: Never Smoker  . Smokeless tobacco: Never Used  Substance and Sexual Activity  . Alcohol use: Yes    Comment: 1 beer/wk  . Drug use: Never  . Sexual activity: Not on file  Other Topics Concern  . Not on file  Social History Narrative  . Not on file   Social Determinants of Health   Financial Resource Strain: Not on file  Food Insecurity: Not on file  Transportation Needs: Not on file  Physical Activity: Not on file  Stress: Not on file  Social Connections: Not on file  Intimate Partner Violence: Not on file     Review of Systems    General:  No chills, fever, night sweats or weight changes.  Cardiovascular:  No  chest pain, dyspnea on exertion, edema, orthopnea, palpitations, paroxysmal nocturnal dyspnea. Dermatological: No rash, lesions/masses Respiratory: No cough, dyspnea Urologic: No hematuria, dysuria Abdominal:   No nausea, vomiting, diarrhea, bright red blood per rectum, melena, or hematemesis Neurologic:  No visual changes, wkns, changes in mental status MSK: R shoulder pain. All other systems reviewed and are otherwise negative except as noted above.  Physical Exam    Blood pressure 116/77, pulse 85, temperature 97.9 F (36.6 C), temperature source Oral, resp. rate 18, height 6\' 1"  (1.854 m), weight 119.6 kg, SpO2 95 %.  General: Pleasant, NAD Psych: Normal affect. Neuro: Alert and oriented X 3. Moves all extremities spontaneously. HEENT: Normal  Neck: Supple without bruits or JVD. Lungs:  Resp regular and unlabored, CTA. Heart: IR, IR, no s3, s4, or murmurs. Abdomen: Soft, non-tender, non-distended, BS + x 4.  Extremities: No clubbing, cyanosis or edema. DP/PT2+, Radials 2+ and equal bilaterally.  Labs    Cardiac Enzymes Recent Labs  Lab 04/27/21 1309 04/27/21 1710  TROPONINIHS 4 4      Lab Results  Component Value Date   WBC 7.6 04/28/2021   HGB 15.8 04/28/2021   HCT 47.0 04/28/2021   MCV 90.0 04/28/2021   PLT 208 04/28/2021    Recent Labs  Lab 04/28/21 0518  NA 137  K 3.9  CL 104  CO2 25  BUN 19  CREATININE 0.84  CALCIUM 9.2  GLUCOSE 139*   Radiology Studies    DG Chest 2 View  Result Date: 04/27/2021 CLINICAL DATA:  Preoperative examination. Patient for shoulder surgery. EXAM: CHEST - 2 VIEW COMPARISON:  None. FINDINGS: Lungs are clear. Heart size is normal. No pneumothorax or pleural fluid. No acute or focal bony abnormality. IMPRESSION: No acute disease. Electronically Signed   By: Inge Rise M.D.   On: 04/27/2021 13:53   [place] OR NERVE BLOCK-IMAGE ONLY [facility])  Result Date: 04/27/2021 There is no interpretation for this exam.  This order is for  images obtained during a surgical procedure.  Please See "Surgeries" Tab for more information regarding the procedure.    ECG & Cardiac Imaging    5/10 - Afib 120, RBBB, no acute ST/T changes - personally reviewed.  Assessment & Plan    1.  Afib w/ RVR:  Pt w/o prior cardiac hx  presented for elective R rotator cuff surgery on 5/10 and was noted to be in afib w/ RVR.  He was completely asymptomatic.  He was known to be in sinus rhythm @ time of preop visit on 5/4, but he does not routinely check his HR/BP @ home, thus duration of afib not currently known. Labs in ED unremarkable and HsTrops nl.  CHA2DS2VASc = 2.  He is currently on eliquis and dilt gtt @ 12.5mg /hr.  Rates are well-controlled and he remains asymptomatic.  Prelim echo read shows nl EF.  Will transition to dilt 60mg  q 6 hrs.  Cont eliquis.  Follow rates today. Ambulate.  If rates stable, plan to transition to long acting dilt prior to discharge and we will see back in office in ~ 2-3 wks to re-eval rhythm and plan DCCV if necessary.  He understands that if DCCV necessary, he will need to remain on uninterrupted [place] x 4 wks post-DCCV, effectively delaying his shoulder surgery ~ 2 mos from today.  We also discussed need for outpt sleep eval.  2.  Essential HTN: Stable on IV dilt currently.  Was on amlodipine previously, will plan to replace w/ oral Dilt (likely 300mg  daily pending rates).  3.  DMII:  A1c 6.5 in Feb. Appears to be diet controlled.  4.  HL:  On crestor w/ LDL of 83 in Feb.  5.  Snoring:  Notes h/o snoring and unrestful sleep.  Will need outpt sleep eval and we can arrange @ f/u.  6.  Morbid obesity:  He understands that he will benefit from more aggressive caloric restriction and increased activity going forward.  Would benefit from outpt nutritional counseling.  7.  GERD:  Cont PPI.  Pt believes that EGD in 2016 showed some evidence of PUD.  Will have to follow H/H on eliquis.  Signed, Murray Hodgkins,  NP 04/28/2021, 8:26 AM  For questions or updates, please contact   Please consult [email or web address] for contact info under Cardiology/STEMI.

## 2021-04-28 NOTE — TOC Transition Note (Signed)
Transition of Care [facility]) - CM/SW Discharge Note   Patient Details  Name: Connor Freeman MRN: [id number] Date of Birth: June 28, 1956  [facility] CM/SW Contact:  Kerin Salen, RN Phone Number: 04/28/2021, 3:02 PM   Clinical Narrative: Patient for discharge to home, wife at bedside will transport home. Patient given Eliquis coupon per Attending request. TOC barriers resolved.      Final next level of care: Home/Self Care Barriers to Discharge: Barriers Resolved   Patient Goals and CMS Choice Patient states their goals for this hospitalization and ongoing recovery are:: To return home.   Choice offered to / list presented to : NA  Discharge Placement                Patient to be transferred to facility by: Wife in the room will transport home. Name of family member notified: Connor Freeman Patient and family notified of of transfer: 04/28/21  Discharge Plan and Services                DME Arranged: N/A DME Agency: NA       HH Arranged: NA HH Agency: NA        Social Determinants of Health (SDOH) Interventions     Readmission Risk Interventions No flowsheet data found.

## 2021-04-30 LAB — GLUCOSE, CAPILLARY: Glucose-Capillary: 120 mg/dL — ABNORMAL HIGH (ref 70–99)

## 2021-05-07 ENCOUNTER — Ambulatory Visit: Payer: BC Managed Care – PPO | Admitting: Nurse Practitioner

## 2021-05-07 ENCOUNTER — Encounter: Payer: Self-pay | Admitting: Nurse Practitioner

## 2021-05-07 ENCOUNTER — Other Ambulatory Visit: Payer: Self-pay

## 2021-05-07 VITALS — BP 140/84 | HR 57 | Ht 73.0 in | Wt 274.0 lb

## 2021-05-07 DIAGNOSIS — G4730 Sleep apnea, unspecified: Secondary | ICD-10-CM

## 2021-05-07 DIAGNOSIS — R0602 Shortness of breath: Secondary | ICD-10-CM

## 2021-05-07 DIAGNOSIS — I480 Paroxysmal atrial fibrillation: Secondary | ICD-10-CM

## 2021-05-07 MED ORDER — DILTIAZEM HCL ER COATED BEADS 240 MG PO CP24: 240.0000 mg | ORAL_CAPSULE | Freq: Every day | ORAL | 5 refills | Status: DC

## 2021-05-07 MED ORDER — APIXABAN 5 MG PO TABS
5.0000 mg | ORAL_TABLET | Freq: Two times a day (BID) | ORAL | 1 refills | Status: DC
Start: 2021-05-07 — End: 2021-08-17

## 2021-05-07 NOTE — Progress Notes (Signed)
Office Visit    Patient Name: Connor Freeman Date of Encounter: 05/07/2021  Primary Care Provider:  Kirk Ruths, MD Primary Cardiologist:  Kate Sable, MD  Chief Complaint    65 year old male with a history of hypertension, hyperlipidemia, obesity, type 2 diabetes mellitus, osteoarthritis, snoring, and GERD, who presents for follow-up after recent hospitalization for atrial fibrillation.  Past Medical History    Past Medical History:  Diagnosis Date  . Arthritis    hands and wrist  . Atrial fibrillation ([place])    a. 04/2012 incidentally noted preop shoulder surgery.  CHA2DS2VASc = 2; b. 04/2021 Echo: EF 60-65%.  No rwma. Nl RV size/fxn.  No significant valvular disease.  . Cancer ([place]) 1990   skin cancer 15 years throat and neck area   . GERD (gastroesophageal reflux disease)   . Hyperlipidemia   . Hypertension   . Obesity   . RBBB   . Right bundle branch block   . Snores   . Type II diabetes mellitus ([place])    Past Surgical History:  Procedure Laterality Date  . excision on skin cancer   1990    Allergies  No Known Allergies  History of Present Illness    65 year old male with above past medical history including hypertension, hyperlipidemia, obesity, diabetes, arthritis, snoring, and GERD.  He has no prior cardiac history or family history of premature CAD.  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  He was scheduled for surgery on May 10, and when he presented that morning, he was found to be in A. fib with RVR with rates in the 120s to 140s.  He was completely asymptomatic.  Surgery was canceled and he was admitted.  Labs were unremarkable with normal troponins.  He was transition from IV to oral diltiazem with reasonable rate control.  In the setting of a CHA2DS2-VASc of 2, we added Eliquis.  Echocardiogram showed normal LV  function without any significant valvular disease.  He was subsequently discharged with plan for early outpatient follow-up and consideration of cardioversion.  Sleep study also recommended.  Since discharge, he has done well.  He occasionally notes dyspnea on exertion but denies chest pain, palpitations, PND, orthopnea, syncope, edema, or early satiety.  He was having some orthostasis associated with lightheadedness and his primary care provider recommended that he discontinue valsartan HCTZ.  Since discontinuing this, orthostatic symptoms have resolved.  Blood pressures at home have been trending mostly in the 130s.  He is in sinus rhythm today.  He was not aware of that.  Home Medications    Prior to Admission medications   Medication Sig Start Date End Date Taking? Authorizing Provider  acetaminophen (TYLENOL) 500 MG tablet Take 1,000 mg by mouth 2 (two) times daily as needed (pain/headaches).    [provider]  apixaban (ELIQUIS) 5 MG TABS tablet Take 1 tablet (5 mg total) by mouth 2 (two) times daily. 04/28/21   Loletha Grayer, MD  Cyanocobalamin (VITAMIN B-12) 5000 MCG SUBL Take 5,000 mcg by mouth in the morning.    [provider]  diclofenac Sodium (VOLTAREN) 1 % GEL Apply 1 application topically 4 (four) times daily as needed (arthritis pain (hands/wrists)).    [provider]  diltiazem (CARDIZEM CD) 240 MG 24 hr capsule Take 1 capsule (240 mg total) by mouth daily. 04/29/21  Loletha Grayer, MD  Melatonin 10 MG CAPS Take 10 mg by mouth at bedtime as needed (sleep).    [provider]  Multiple Vitamin (MULTIVITAMIN WITH MINERALS) TABS tablet Take 1 tablet by mouth in the morning. Centrum Silver    [provider]  omeprazole (PRILOSEC) 40 MG capsule Take 40 mg by mouth in the morning.    [provider]  rosuvastatin (CRESTOR) 40 MG tablet Take 40 mg by mouth in the morning.    [provider]    Review of Systems     Orthostatic lightheadedness that improved after discontinuing valsartan HCTZ.  Occasionally notes dyspnea at higher levels of activity.  He notes a fair amount of anxiety, especially related to his family history of coronary disease.  He denies chest pain, palpitations, PND, orthopnea, syncope, edema, or early satiety.  All other systems reviewed and are otherwise negative except as noted above.  Physical Exam    VS:  BP 140/84   Pulse (!) 57   Ht 6\' 1"  (1.854 m)   Wt 274 lb (124.3 kg)   BMI 36.15 kg/m  , BMI Body mass index is 36.15 kg/m.  Stop-bang = 8 GEN: Obese, in no acute distress. HEENT: normal. Neck: Obese, difficult to gauge JVP.  No carotid bruits, or masses. Cardiac: RRR, no murmurs, rubs, or gallops. No clubbing, cyanosis, edema.  Radials/PT 2+ and equal bilaterally.  Respiratory:  Respirations regular and unlabored, clear to auscultation bilaterally. GI: Obese, soft, nontender, nondistended, BS + x 4. MS: no deformity or atrophy. Skin: warm and dry, no rash. Neuro:  Strength and sensation are intact. Psych: Normal affect.  Accessory Clinical Findings    ECG personally reviewed by me today -sinus bradycardia, 57, right bundle branch block- no acute changes.  Lab Results  Component Value Date   WBC 7.6 04/28/2021   HGB 15.8 04/28/2021   HCT 47.0 04/28/2021   MCV 90.0 04/28/2021   PLT 208 04/28/2021   Lab Results  Component Value Date   CREATININE 0.84 04/28/2021   BUN 19 04/28/2021   NA 137 04/28/2021   K 3.9 04/28/2021   CL 104 04/28/2021   CO2 25 04/28/2021    Assessment & Plan    1.  Paroxysmal atrial fibrillation: Patient recently presented for right rotator cuff surgery and was found to be in rapid atrial fibrillation.  He was admitted to the hospital and placed on diltiazem infusion followed by oral diltiazem.  He was adequately rate controlled.  Eliquis was started in the setting of a CHA2DS2-VASc of 2.  Echo showed normal LV function.  Since  discharge, he did have some orthostatic lightheadedness prompting discontinuation of valsartan-HCTZ.  This has since resolved.  He is in sinus rhythm today.  We will continue current dose of diltiazem and Eliquis.  Arranging for sleep study as outlined below.  Patient has a significant family history of coronary artery disease and some degree of dyspnea at higher levels of activity.  We will risk stratify with 2-day Lexiscan Myoview.  Follow-up in office in 1 month at which time we will follow-up CBC and basic metabolic panel.  2.  Family history premature CAD/dyspnea: Patient has a significant family history of CAD and he himself has multiple risk factors including obesity, diabetes, hypertension, and hyperlipidemia.  He generally gets around pretty well and does not experience chest pain but does note dyspnea at higher levels of activity.  I will arrange for a Lexiscan Myoview to risk stratify,  especially in light of A. fib, in case he were to require an antiarrhythmic at some point in the future.  3.  Essential hypertension: Blood pressure elevated today at 140/84.  Since stopping valsartan HCTZ, pressures have been in the 130s at home.  I have encouraged him to continue to follow closely as I would have a low threshold to add back a lower dose of valsartan for blood pressure management, especially in light of diabetes.  4.  Hyperlipidemia: On statin therapy with an LDL of 83 in February.  Normal LFTs at that time.  5.  Sleep disordered breathing: Patient with a history of snoring, gasping, daytime sleepiness, and large neck circumference.  STOP-BANG = 8.  We will arrange for sleep study.  6.  Type 2 diabetes mellitus: Diet controlled with an A1c of 6.5.  Followed by primary care.  Encouraged weight loss.  Continue statin.  Consider resumption of low-dose ARB if blood pressure is elevated at home.  7.  Disposition: Follow-up sleep study.  Follow-up stress testing.  Follow-up in clinic in 1 month with  CBC and basic metabolic panel at that time.  Murray Hodgkins, NP 05/07/2021, 2:40 PM

## 2021-05-07 NOTE — Patient Instructions (Signed)
Medication Instructions:  Your physician recommends that you continue on your current medications as directed. Please refer to the Current Medication list given to you today.  Eliquis and Diltiazem has been refilled today.  *If you need a refill on your cardiac medications before your next appointment, please call your pharmacy*   Lab Work: None ordered If you have labs (blood work) drawn today and your tests are completely normal, you will receive your results only by: Marland Kitchen MyChart Message (if you have MyChart) OR . A paper copy in the mail If you have any lab test that is abnormal or we need to change your treatment, we will call you to review the results.   Testing/Procedures: Your physician has requested that you have a lexiscan myoview. For further information please visit [email or web address]. Please follow instruction sheet, as given.   You have been given a WatchPAT at home sleep study device today. Please follo the instructions given.   Follow-Up: At [facility], you and your health needs are our priority.  As part of our continuing mission to provide you with exceptional heart care, we have created designated Provider Care Teams.  These Care Teams include your primary Cardiologist (physician) and Advanced Practice Providers (APPs -  Physician Assistants and Nurse Practitioners) who all work together to provide you with the care you need, when you need it.  We recommend signing up for the patient portal called "MyChart".  Sign up information is provided on this After Visit Summary.  MyChart is used to connect with patients for Virtual Visits (Telemedicine).  Patients are able to view lab/test results, encounter notes, upcoming appointments, etc.  Non-urgent messages can be sent to your provider as well.   To learn more about what you can do with MyChart, go to [email or web address].    Your next appointment:   4 week(s)  The format for your next appointment:   In  Person  Provider:   You may see Kate Sable, MD or one of the following Advanced Practice Providers on your designated Care Team:    Murray Hodgkins, NP    Other Instructions [place]  Your caregiver has ordered a Stress Test with nuclear imaging. The purpose of this test is to evaluate the blood supply to your heart muscle. This procedure is referred to as a "Non-Invasive Stress Test." This is because other than having an IV started in your vein, nothing is inserted or "invades" your body. Cardiac stress tests are done to find areas of poor blood flow to the heart by determining the extent of coronary artery disease (CAD). Some patients exercise on a treadmill, which naturally increases the blood flow to your heart, while others who are  unable to walk on a treadmill due to physical limitations have a pharmacologic/chemical stress agent called Lexiscan . This medicine will mimic walking on a treadmill by temporarily increasing your coronary blood flow.   Please note: these test may take anywhere between 2-4 hours to complete  PLEASE REPORT TO [facility] AT THE FIRST DESK WILL DIRECT YOU WHERE TO GO  Date of Procedure:_____________________________________  Arrival Time for Procedure:______________________________  Instructions regarding medication:    PLEASE NOTIFY THE OFFICE AT LEAST 24 HOURS IN ADVANCE IF YOU ARE UNABLE TO KEEP YOUR APPOINTMENT.  [phone] AND  PLEASE NOTIFY NUCLEAR MEDICINE AT [facility] AT LEAST 24 HOURS IN ADVANCE IF YOU ARE UNABLE TO KEEP YOUR APPOINTMENT. [phone]  How to prepare for your Myoview test:  1. Do not eat or drink after midnight 2. No caffeine for 24 hours prior to test 3. No smoking 24 hours prior to test. 4. Your medication may be taken with water.  If your doctor stopped a medication because of this test, do not take that medication. 5. Please wear a short sleeve shirt. 6. [place] or  lotion. 7. Wear comfortable walking shoes. No heels!

## 2021-05-11 ENCOUNTER — Telehealth: Payer: Self-pay | Admitting: *Deleted

## 2021-05-11 ENCOUNTER — Telehealth: Payer: Self-pay

## 2021-05-11 NOTE — Telephone Encounter (Signed)
Ordered by Ignacia Bayley, NP>> to covering RN to notify patient.

## 2021-05-11 NOTE — Telephone Encounter (Signed)
Per AIM prior authorization is not required per [facility].  [id number]).  Message sent to [facility] triage pool to notify patienthe can complete study.

## 2021-05-11 NOTE — Telephone Encounter (Signed)
-----   Message from Chapman Moss, RN sent at 05/11/2021  1:35 PM EDT ----- Regarding: Precertification for [place] Per AIM prior authorization is not required per [facility].  [id number]). Please notify patient to complete study.   Thank you

## 2021-05-11 NOTE — Telephone Encounter (Signed)
Left voicemail message for patient to call back for review of sleep device.

## 2021-05-12 NOTE — Telephone Encounter (Signed)
Patient returning call, please advise when able

## 2021-05-13 NOTE — Telephone Encounter (Signed)
Left voicemail message that pin number for device is [phone] but to please call back for confirmation on this.

## 2021-05-14 NOTE — Telephone Encounter (Signed)
Spoke with patient and made him aware that he is able to proceed with the Watchpat sleep device and that his pin number is [id number]. He verbalized understanding with no further questions at this time.

## 2021-05-15 ENCOUNTER — Encounter (INDEPENDENT_AMBULATORY_CARE_PROVIDER_SITE_OTHER): Payer: BC Managed Care – PPO | Admitting: Cardiology

## 2021-05-15 DIAGNOSIS — G4733 Obstructive sleep apnea (adult) (pediatric): Secondary | ICD-10-CM

## 2021-05-19 ENCOUNTER — Telehealth: Payer: Self-pay | Admitting: Cardiology

## 2021-05-19 NOTE — Telephone Encounter (Signed)
    Lattie Haw with [facility] calling, she gave PA approval for pt nuclear test. auth #  [id number]

## 2021-05-19 NOTE — Telephone Encounter (Signed)
To pre cert pool- unsure if they were waiting on this information.  The patient is currently scheduled for his myoview on 05/24/21.

## 2021-05-24 ENCOUNTER — Ambulatory Visit
Admission: RE | Admit: 2021-05-24 | Discharge: 2021-05-24 | Disposition: A | Discharge status: Home / Self Care | Payer: BC Managed Care – PPO | Source: Ambulatory Visit | Attending: Nurse Practitioner | Admitting: Nurse Practitioner

## 2021-05-24 ENCOUNTER — Ambulatory Visit: Payer: BC Managed Care – PPO

## 2021-05-24 DIAGNOSIS — R0602 Shortness of breath: Secondary | ICD-10-CM | POA: Insufficient documentation

## 2021-05-24 DIAGNOSIS — G4730 Sleep apnea, unspecified: Secondary | ICD-10-CM

## 2021-05-24 LAB — NM MYOCAR MULTI W/SPECT W/WALL MOTION / EF
Estimated workload: 1 METS
Exercise duration (min): 0 min
Exercise duration (sec): 0 s
LV dias vol: 109 mL (ref 62–150)
LV sys vol: 30 mL
MPHR: 157 {beats}/min
Peak HR: 87 {beats}/min
Percent HR: 55 %
Rest HR: 56 {beats}/min
SDS: 0
SRS: 1
SSS: 0
TID: 0.96

## 2021-05-24 MED ORDER — TECHNETIUM TC 99M TETROFOSMIN IV KIT
10.9300 | PACK | Freq: Once | INTRAVENOUS | Status: AC | PRN
  Administered 2021-05-24: 10.93 via INTRAVENOUS

## 2021-05-24 MED ORDER — REGADENOSON 0.4 MG/5ML IV SOLN
0.4000 mg | Freq: Once | INTRAVENOUS | Status: AC
  Administered 2021-05-24: 0.4 mg via INTRAVENOUS

## 2021-05-24 MED ORDER — TECHNETIUM TC 99M TETROFOSMIN IV KIT
31.1400 | PACK | Freq: Once | INTRAVENOUS | Status: AC | PRN
  Administered 2021-05-24: 31.14 via INTRAVENOUS

## 2021-05-24 NOTE — Procedures (Signed)
   Sleep Study Report  Patient Information Name: Connor Freeman  ID: [id number] Birth Date: 1956-02-11  Age: 65 Gender: Male BMI: 36.5 (W=275 lb, H=6' 1'') Study Date: 05/15/2021 Referring Physician:  Murray Hodgkins, NP  TEST DESCRIPTION: Home sleep apnea testing was completed using the WatchPat, a Type 1 device, utilizing  peripheral arterial tonometry (PAT), chest movement, actigraphy, pulse oximetry, pulse rate, body position and snore.  AHI was calculated with apnea and hypopnea using valid sleep time as the denominator. RDI includes apneas,  hypopneas, and RERAs. The data acquired and the scoring of sleep and all associated events were performed in  accordance with the recommended standards and specifications as outlined in the AASM Manual for the Scoring of  Sleep and Associated Events 2.2.0 (2015).  FINDINGS: 1. Moderate Obstructive Sleep Apnea with AHI 18.7/hr.  2. No Central Sleep Apnea with pAHIc 1.2/hr. 3. Oxygen desaturations as low as 80%. 4. Mild snoring was present. O2 sats were < 88% for 11.1 min. 5. Total sleep time was 5 hrs and 19 min. 6. 28.4% of total sleep time was spent in REM sleep.  7. Normal sleep onset latency at 20 min.  8. Shortened REM sleep onset latency at 47 min.  9. Total awakenings were 10.   DIAGNOSIS:  Moderate Obstructive Sleep Apnea (G47.33) Nocturnal Hypoxemia  RECOMMENDATIONS: 1. Clinical correlation of these findings is necessary. The decision to treat obstructive sleep apnea (OSA) is usually  based on the presence of apnea symptoms or the presence of associated medical conditions such as Hypertension,  Congestive Heart Failure, Atrial Fibrillation or Obesity. The most common symptoms of OSA are snoring, gasping for  breath while sleeping, daytime sleepiness and fatigue.   2. Initiating apnea therapy is recommended given the presence of symptoms and/or associated conditions.  Recommend proceeding with one of the following:   a.  Auto-CPAP therapy with a pressure range of 5-20cm H2O.   b. An oral appliance (OA) that can be obtained from certain dentists with expertise in sleep medicine. These are  primarily of use in non-obese patients with mild and moderate disease.   c. An ENT consultation which may be useful to look for specific causes of obstruction and possible treatment  Options.   d. If patient is intolerant to PAP therapy, consider referral to ENT for evaluation for hypoglossal nerve stimulator.   3. Close follow-up is necessary to ensure success with CPAP or oral appliance therapy for maximum benefit .  4. A follow-up oximetry study on CPAP is recommended to assess the adequacy of therapy and determine the need  for supplemental oxygen or the potential need for Bi-level therapy. An arterial blood gas to determine the adequacy of  baseline ventilation and oxygenation should also be considered.  5. Healthy sleep recommendations include: adequate nightly sleep (normal 7-9 hrs/night), avoidance of caffeine after  noon and alcohol near bedtime, and maintaining a sleep environment that is cool, dark and quiet.  6. Weight loss for overweight patients is recommended. Even modest amounts of weight loss can significantly  improve the severity of sleep apnea.  7. Snoring recommendations include: weight loss where appropriate, side sleeping, and avoidance of alcohol before  Bed.  8. Operation of motor vehicle or dangerous equipment must be avoided when feeling drowsy, excessively sleepy, or  mentally fatigued.  Report prepared by: Signature: Fransico Him, MD; [facility]; [place], [place] Board of Sleep Medicine  Electronically Signed: May 24, 2021

## 2021-05-25 ENCOUNTER — Telehealth: Payer: Self-pay | Admitting: *Deleted

## 2021-05-25 NOTE — Telephone Encounter (Signed)
-----   Message from Theora Gianotti, NP sent at 05/24/2021  6:58 PM EDT ----- Low risk stress test w/o suggestion of heart artery blockage. Normal heart squeezing function.  Very reassuring. Imaging does show a minimal amt of coronary calcium.  Cont rosuvastatin therapy.

## 2021-05-25 NOTE — Telephone Encounter (Signed)
Left voicemail message to call back for review of results.  

## 2021-05-28 NOTE — Telephone Encounter (Signed)
Left voicemail message to call back for review of results.  

## 2021-05-31 NOTE — Telephone Encounter (Signed)
Left voicemail message to call back  

## 2021-05-31 NOTE — Telephone Encounter (Signed)
Reviewed results with patient and he verbalized understanding with no further questions. Confirmed upcoming appointment.

## 2021-05-31 NOTE — Telephone Encounter (Signed)
Patient returning call.

## 2021-05-31 NOTE — Telephone Encounter (Signed)
Left voicemail message to call back for review of results.  

## 2021-06-07 ENCOUNTER — Telehealth: Payer: Self-pay | Admitting: *Deleted

## 2021-06-07 ENCOUNTER — Encounter: Payer: Self-pay | Admitting: Cardiology

## 2021-06-07 ENCOUNTER — Ambulatory Visit (INDEPENDENT_AMBULATORY_CARE_PROVIDER_SITE_OTHER): Payer: BC Managed Care – PPO | Admitting: Cardiology

## 2021-06-07 ENCOUNTER — Other Ambulatory Visit: Payer: Self-pay

## 2021-06-07 VITALS — BP 128/80 | HR 58 | Ht 72.0 in | Wt 270.0 lb

## 2021-06-07 DIAGNOSIS — I4891 Unspecified atrial fibrillation: Secondary | ICD-10-CM | POA: Diagnosis not present

## 2021-06-07 DIAGNOSIS — E7800 Pure hypercholesterolemia, unspecified: Secondary | ICD-10-CM

## 2021-06-07 DIAGNOSIS — G4733 Obstructive sleep apnea (adult) (pediatric): Secondary | ICD-10-CM

## 2021-06-07 DIAGNOSIS — Z01810 Encounter for preprocedural cardiovascular examination: Secondary | ICD-10-CM | POA: Diagnosis not present

## 2021-06-07 DIAGNOSIS — Z01818 Encounter for other preprocedural examination: Secondary | ICD-10-CM

## 2021-06-07 NOTE — Telephone Encounter (Signed)
Informed patient of sleep study results and patient understanding was verbalized.   DME selection is [facility].. Patient understands she/he will be contacted by [facility] to set up her/he cpap. Patient understands to call if [facility] does not contact her/he with new setup in a timely manner. Patient understands they will be called once confirmation has been received from [facility] that they have received their new machine to schedule 10 week follow up appointment.   [facility] notified of new cpap order  Please add to airview Patient was grateful for the call and thanked me.

## 2021-06-07 NOTE — Telephone Encounter (Signed)
-----   Message from Sueanne Margarita, MD sent at 05/24/2021  5:26 PM EDT ----- Please let patient know that they have sleep apnea and recommend treating with CPAP.  Please order an auto CPAP from 4-15cm H2O with heated humidity and mask of choice.  Order overnight pulse ox on CPAP.  Followup with me in 6 weeks.

## 2021-06-07 NOTE — Patient Instructions (Signed)

## 2021-06-07 NOTE — Progress Notes (Signed)
Cardiology Office Note:    Date:  06/07/2021   ID:  Connor Freeman, DOB [birth year], MRN [id number]  PCP:  Kirk Ruths, MD   [facility] Providers Cardiologist:  Kate Sable, MD     Referring MD: Kirk Ruths, MD   Chief Complaint  Patient presents with   OTher    Follow up post Testing. Meds reviewed verbally with patient.     History of Present Illness:    Connor Freeman is a 65 y.o. male with a hx of paroxysmal atrial fibrillation, hypertension, DM, hyperlipidemia, obesity, OSA who presents for follow-up.  Patient initially seen in the hospital 04/28/2021 due to A. fib RVR.  Initially presented for possible right rotator cuff repair.  EKG showed A. fib RVR.  He was managed with Cardizem and Eliquis.  He followed up with sleeps medicine, heart sleep study performed due to snoring, he was diagnosed with moderate OSA.  He has not heard any feedback regarding CPAP mask or equipment.  Patient complained of dyspnea on exertion which prompted performance of Lexiscan Myoview.  He feels well today, has no concerns at this time.  Waiting on cardiac restratification in order to obtain his right shoulder/rotator cuff surgery.  Echo 04/2021 showed normal systolic function, EF 60 to 65%. [place] 05/24/2021, low risk study, no evidence for ischemia.  Past Medical History:  Diagnosis Date   Arthritis    hands and wrist   Atrial fibrillation ([place])    a. 04/2012 incidentally noted preop shoulder surgery.  CHA2DS2VASc = 2; b. 04/2021 Echo: EF 60-65%.  No rwma. Nl RV size/fxn.  No significant valvular disease.   Cancer ([place]) 1990   skin cancer 15 years throat and neck area    GERD (gastroesophageal reflux disease)    Hyperlipidemia    Hypertension    Obesity    RBBB    Right bundle branch block    Snores    Type II diabetes mellitus ([place])     Past Surgical History:  Procedure Laterality Date   excision on skin cancer   1990    Current Medications: Current  Meds  Medication Sig   apixaban (ELIQUIS) 5 MG TABS tablet Take 1 tablet (5 mg total) by mouth 2 (two) times daily.   Cyanocobalamin (VITAMIN B-12) 5000 MCG SUBL Take 5,000 mcg by mouth in the morning.   diclofenac Sodium (VOLTAREN) 1 % GEL Apply 1 application topically 4 (four) times daily as needed (arthritis pain (hands/wrists)).   diltiazem (CARDIZEM CD) 240 MG 24 hr capsule Take 1 capsule (240 mg total) by mouth daily.   Melatonin 10 MG CAPS Take 10 mg by mouth at bedtime as needed (sleep).   Multiple Vitamin (MULTIVITAMIN WITH MINERALS) TABS tablet Take 1 tablet by mouth in the morning. Centrum Silver   omeprazole (PRILOSEC) 40 MG capsule Take 40 mg by mouth in the morning.   rosuvastatin (CRESTOR) 40 MG tablet Take 40 mg by mouth in the morning.     Allergies:   Patient has no known allergies.   Social History   Socioeconomic History   Marital status: Married    Spouse name: Not on file   Number of children: Not on file   Years of education: Not on file   Highest education level: Not on file  Occupational History   Not on file  Tobacco Use   Smoking status: Never   Smokeless tobacco: Never  Substance and Sexual Activity   Alcohol use: Yes  Comment: 1 beer/wk   Drug use: Never   Sexual activity: Not on file  Other Topics Concern   Not on file  Social History Narrative   Not on file   Social Determinants of Health   Financial Resource Strain: Not on file  Food Insecurity: Not on file  Transportation Needs: Not on file  Physical Activity: Not on file  Stress: Not on file  Social Connections: Not on file     Family History: The patient's family history includes Heart attack in his father; Heart defect in his brother; Stroke in his brother and mother.  ROS:   Please see the history of present illness.     All other systems reviewed and are negative.  EKGs/Labs/Other Studies Reviewed:    The following studies were reviewed today:   EKG:  EKG is  ordered  today.  The ekg ordered today demonstrates normal sinus rhythm.  Recent Labs: 04/27/2021: B Natriuretic Peptide 395.7; Magnesium 2.1; TSH 1.948 04/28/2021: BUN 19; Creatinine, Ser 0.84; Hemoglobin 15.8; Platelets 208; Potassium 3.9; Sodium 137  Recent Lipid Panel No results found for: CHOL, TRIG, HDL, CHOLHDL, VLDL, LDLCALC, LDLDIRECT   Risk Assessment/Calculations:          Physical Exam:    VS:  BP 128/80 (BP Location: Left Arm, Patient Position: Sitting, Cuff Size: Normal)   Pulse (!) 58   Ht 6' (1.829 m)   Wt 270 lb (122.5 kg)   SpO2 97%   BMI 36.62 kg/m     Wt Readings from Last 3 Encounters:  06/07/21 270 lb (122.5 kg)  05/07/21 274 lb (124.3 kg)  04/28/21 263 lb 10.7 oz (119.6 kg)     GEN:  Well nourished, well developed in no acute distress HEENT: Normal NECK: No JVD; No carotid bruits LYMPHATICS: No lymphadenopathy CARDIAC: RRR, no murmurs, rubs, gallops RESPIRATORY:  Clear to auscultation without rales, wheezing or rhonchi  ABDOMEN: Soft, non-tender, non-distended MUSCULOSKELETAL:  No edema; No deformity  SKIN: Warm and dry NEUROLOGIC:  Alert and oriented x 3 PSYCHIATRIC:  Normal affect   ASSESSMENT:    1. Atrial fibrillation, unspecified type ([place])   2. Pure hypercholesterolemia   3. OSA (obstructive sleep apnea)   4. Pre-op evaluation    PLAN:    In order of problems listed above:  Paroxysmal atrial fibrillation, CHA2DS2-VASc score 2(htn, dm).  Currently in sinus rhythm.  Echo with preserved ejection fraction, EF 60 to 65%.  Lexiscan Myoview no evidence for ischemia.  Continue Cardizem, Eliquis.  Treat sleep apnea as below. Hyperlipidemia, continue statin. Recently diagnosed OSA, referred to pulm medicine for treatment, management of OSA. Preop restratification, right shoulder surgery being planned.  Patient can undergo surgical procedure from a cardiac perspective with no additional testing.  Echo shows preserved ejection fraction, Lexiscan Myoview  with no evidence for ischemia.  Currently in sinus rhythm, heart rate controlled with Cardizem.  Okay to hold Eliquis 48 hours prior to surgical procedure.  Restart as soon as safely possible after surgery.  Follow-up in 6 months.      Medication Adjustments/Labs and Tests Ordered: Current medicines are reviewed at length with the patient today.  Concerns regarding medicines are outlined above.  Orders Placed This Encounter  Procedures   Ambulatory referral to Pulmonology   EKG 12-Lead   No orders of the defined types were placed in this encounter.   Patient Instructions  Medication Instructions:   Your physician recommends that you continue on your current medications as directed.  Please refer to the Current Medication list given to you today.  *If you need a refill on your cardiac medications before your next appointment, please call your pharmacy*   Lab Work: None ordered If you have labs (blood work) drawn today and your tests are completely normal, you will receive your results only by: [place] (if you have MyChart) OR A paper copy in the mail If you have any lab test that is abnormal or we need to change your treatment, we will call you to review the results.   Testing/Procedures: None ordered   Follow-Up: At [facility], you and your health needs are our priority.  As part of our continuing mission to provide you with exceptional heart care, we have created designated Provider Care Teams.  These Care Teams include your primary Cardiologist (physician) and Advanced Practice Providers (APPs -  Physician Assistants and Nurse Practitioners) who all work together to provide you with the care you need, when you need it.  We recommend signing up for the patient portal called "MyChart".  Sign up information is provided on this After Visit Summary.  MyChart is used to connect with patients for Virtual Visits (Telemedicine).  Patients are able to view lab/test results,  encounter notes, upcoming appointments, etc.  Non-urgent messages can be sent to your provider as well.   To learn more about what you can do with MyChart, go to [email or web address].    Your next appointment:   6 month(s)  The format for your next appointment:   In Person  Provider:   Kate Sable, MD   Other Instructions     Signed, Kate Sable, MD  06/07/2021 5:02 PM    [place]

## 2021-06-28 ENCOUNTER — Telehealth: Payer: Self-pay | Admitting: Cardiology

## 2021-06-28 NOTE — Telephone Encounter (Signed)
   [facility] Pre-operative Risk Assessment    Patient Name: Connor Freeman  DOB: 26-Nov-1956 MRN: [id number]  HEARTCARE STAFF:  - IMPORTANT!!!!!! Under Visit Info/Reason for Call, type in Other and utilize the format Clearance MM/DD/YY or Clearance TBD. Do not use dashes or single digits. - Please review there is not already an duplicate clearance open for this procedure. - If request is for dental extraction, please clarify the # of teeth to be extracted. - If the patient is currently at the dentist's office, call Pre-Op Callback Staff (MA/nurse) to input urgent request.  - If the patient is not currently in the dentist office, please route to the Pre-Op pool.  Request for surgical clearance:  What type of surgery is being performed? Right shoulder  When is this surgery scheduled? TBD  What type of clearance is required (medical clearance vs. Pharmacy clearance to hold med vs. Both)? both  Are there any medications that need to be held prior to surgery and how long? Not listed, please provide if needed  Practice name and name of physician performing surgery? [facility] - Dr Connor Freeman  What is the office phone number? [phone]   7.   What is the office fax number? [phone]  8.   Anesthesia type (None, local, MAC, general) ? Not listed   [name] 06/28/2021, 2:56 PM  _________________________________________________________________   (provider comments below)

## 2021-06-28 NOTE — Telephone Encounter (Signed)
I have faxed Dr. Thereasa Solo note that contains clearance and eliquis hold.

## 2021-07-13 ENCOUNTER — Ambulatory Visit (INDEPENDENT_AMBULATORY_CARE_PROVIDER_SITE_OTHER): Payer: BC Managed Care – PPO | Admitting: Adult Health

## 2021-07-13 ENCOUNTER — Other Ambulatory Visit: Payer: Self-pay

## 2021-07-13 ENCOUNTER — Encounter: Payer: Self-pay | Admitting: Adult Health

## 2021-07-13 DIAGNOSIS — G4733 Obstructive sleep apnea (adult) (pediatric): Secondary | ICD-10-CM | POA: Diagnosis not present

## 2021-07-13 DIAGNOSIS — I480 Paroxysmal atrial fibrillation: Secondary | ICD-10-CM | POA: Diagnosis not present

## 2021-07-13 NOTE — Assessment & Plan Note (Signed)
Moderate obstructive sleep apnea patient education given on sleep apnea and treatment options including weight loss, CPAP. Potential cardiovascular risk factors discussed. Agree with patient to proceed with CPAP therapy.  Patient education on CPAP and CPAP usage along with maintenance.  Plan  Patient Instructions  Begin CPAP At bedtime   Wear all night long  Work on healthy weight loss.  Avoid sedating medications  Do not drive if sleepy  Follow up in 2 months and As needed

## 2021-07-13 NOTE — Patient Instructions (Signed)
Begin CPAP At bedtime   Wear all night long  Work on healthy weight loss.  Avoid sedating medications  Do not drive if sleepy  Follow up in 2 months and As needed

## 2021-07-13 NOTE — Assessment & Plan Note (Signed)
Healthy weight loss discussed 

## 2021-07-13 NOTE — Assessment & Plan Note (Signed)
Episode of A. fib with RVR May 2022.  Patient appears to be in sinus rhythm today.  Continue follow-up with cardiology.  Continue current maintenance regimen.

## 2021-07-13 NOTE — Progress Notes (Signed)
$'@Patient'f$  ID: Connor Freeman, male    DOB: 1955-12-26, 65 y.o.   MRN: [id number]  Chief Complaint  Patient presents with   Follow-up    Referring provider: Kate Sable, MD  HPI: 65 year old male seen for sleep consult July 13, 2021 to establish for sleep apnea Medical history significant for A. fib, diabetes, hyperlipidemia  TEST/EVENTS :   07/13/2021 Sleep Consult  Patient presents for an initial sleep consult.  Patient was undergoing a right rotator cuff repair in May 2022.  Day of surgery patient was noted to be in A. fib with RVR.  He required admission, was seen by cardiology.  Started on Cardizem and Eliquis.  During A. fib work-up patient was set up for outpatient home sleep study.  Home sleep study was done on May 15, 2021.  This showed moderate obstructive sleep apnea with AHI at 18.7/hour.  SPO2 low at 80% We discussed his sleep study results in detail.  Patient has been recommended to begin CPAP.  This order has been sent to a local DME company [employer].  Patient says he has a pickup date of his CPAP in 2 weeks.  CPAP settings have been set on auto CPAP 4 to 15 cm H2O. Patient says his wife reports snoring.  He has restless sleep.  Says he has never been a good sleeper only gets in about 4 to 5 hours of sleep at most.  Patient is retired from [employer] work.  But works part-time in [employer].  Patient says he feels good is very active.  He does not nap.  Epworth score is 3.  He denies any symptoms suspicious for cataplexy, sleepwalking or teeth grinding.,  Sleep paralysis.  Typically gets in 3 cups of coffee daily.  Denies any energy drinks or Pills. Typically goes to bed every night between 830 and 11 PM.  Takes about an hour to go to sleep.  Typically gets up in the morning about 3:15 AM for work.  Weight has trended up about 20 pounds over the last 2 years.  Social history patient is retired.  Works part-time.  Is married.  Has adult children.  Is a never  smoker.  Drinks rarely.  No drug use.  Family history significant for asthma, heart disease and cancer.  Patient is planning on going back for right rotator cuff surgery soon.  No Known Allergies  Immunization History  Administered Date(s) Administered   Influenza Split 09/17/2015   Influenza,inj,Quad PF,6+ Mos 09/16/2017   PFIZER(Purple Top)SARS-COV-2 Vaccination 03/27/2020, 04/21/2020   Pneumococcal Conjugate-13 12/19/2019   Pneumococcal Polysaccharide-23 10/18/2018   Td,absorbed, Preservative Free, Adult Use, Lf Unspecified 11/08/2016    Past Medical History:  Diagnosis Date   Arthritis    hands and wrist   Atrial fibrillation ([place])    a. 04/2012 incidentally noted preop shoulder surgery.  CHA2DS2VASc = 2; b. 04/2021 Echo: EF 60-65%.  No rwma. Nl RV size/fxn.  No significant valvular disease.   Cancer ([place]) 1990   skin cancer 15 years throat and neck area    GERD (gastroesophageal reflux disease)    Hyperlipidemia    Hypertension    Obesity    RBBB    Right bundle branch block    Snores    Type II diabetes mellitus ([place])     Tobacco History: Social History   Tobacco Use  Smoking Status Never  Smokeless Tobacco Never   Counseling given: Not Answered   Outpatient Medications Prior to Visit  Medication Sig Dispense Refill   apixaban (ELIQUIS) 5 MG TABS tablet Take 1 tablet (5 mg total) by mouth 2 (two) times daily. 60 tablet 1   Cyanocobalamin (VITAMIN B-12) 5000 MCG SUBL Take 5,000 mcg by mouth in the morning.     diltiazem (CARDIZEM CD) 240 MG 24 hr capsule Take 1 capsule (240 mg total) by mouth daily. 30 capsule 5   Multiple Vitamin (MULTIVITAMIN WITH MINERALS) TABS tablet Take 1 tablet by mouth in the morning. Centrum Silver     omeprazole (PRILOSEC) 40 MG capsule Take 40 mg by mouth in the morning.     rosuvastatin (CRESTOR) 40 MG tablet Take 40 mg by mouth in the morning.     valsartan-hydrochlorothiazide (DIOVAN-HCT) 320-25 MG tablet Take 1 tablet by mouth  daily.     diclofenac Sodium (VOLTAREN) 1 % GEL Apply 1 application topically 4 (four) times daily as needed (arthritis pain (hands/wrists)). (Patient not taking: Reported on 07/13/2021)     Melatonin 10 MG CAPS Take 10 mg by mouth at bedtime as needed (sleep). (Patient not taking: Reported on 07/13/2021)     No facility-administered medications prior to visit.     Review of Systems:   Constitutional:   No  weight loss, night sweats,  Fevers, chills, fatigue, or  lassitude.  HEENT:   No headaches,  Difficulty swallowing,  Tooth/dental problems, or  Sore throat,                No sneezing, itching, ear ache, nasal congestion, post nasal drip,   CV:  No chest pain,  Orthopnea, PND, swelling in lower extremities, anasarca, dizziness, palpitations, syncope.   GI  No heartburn, indigestion, abdominal pain, nausea, vomiting, diarrhea, change in bowel habits, loss of appetite, bloody stools.   Resp: No shortness of breath with exertion or at rest.  No excess mucus, no productive cough,  No non-productive cough,  No coughing up of blood.  No change in color of mucus.  No wheezing.  No chest wall deformity  Skin: no rash or lesions.  GU: no dysuria, change in color of urine, no urgency or frequency.  No flank pain, no hematuria   MS:  No joint pain or swelling.  No decreased range of motion.  No back pain.    Physical Exam  BP (!) 150/88 (BP Location: Left Arm, Patient Position: Sitting, Cuff Size: Normal)   Pulse 69   Temp 99.3 F (37.4 C) (Oral)   Ht 6' (1.829 m)   Wt 270 lb 3.2 oz (122.6 kg)   SpO2 97%   BMI 36.65 kg/m   GEN: A/Ox3; pleasant , NAD, well nourished    HEENT:  [place]/AT,   , NOSE-clear, THROAT-clear, no lesions, no postnasal drip or exudate noted.  Class II-III MP airway  NECK:  Supple w/ fair ROM; no JVD; normal carotid impulses w/o bruits; no thyromegaly or nodules palpated; no lymphadenopathy.    RESP  Clear  P & A; w/o, wheezes/ rales/ or rhonchi. no accessory  muscle use, no dullness to percussion  CARD:  RRR, no m/r/g, no peripheral edema, pulses intact, no cyanosis or clubbing.  GI:   Soft & nt; nml bowel sounds; no organomegaly or masses detected.   Musco: Warm bil, no deformities or joint swelling noted.   Neuro: alert, no focal deficits noted.    Skin: Warm, no lesions or rashes    Lab Results:  CBC  ProBNP No results found for: PROBNP  Imaging: No  results found.    No flowsheet data found.  No results found for: NITRICOXIDE      Assessment & Plan:   OSA (obstructive sleep apnea) Moderate obstructive sleep apnea patient education given on sleep apnea and treatment options including weight loss, CPAP. Potential cardiovascular risk factors discussed. Agree with patient to proceed with CPAP therapy.  Patient education on CPAP and CPAP usage along with maintenance.  Plan  Patient Instructions  Begin CPAP At bedtime   Wear all night long  Work on healthy weight loss.  Avoid sedating medications  Do not drive if sleepy  Follow up in 2 months and As needed        Morbid obesity ([place]) Healthy weight loss discussed  AF (paroxysmal atrial fibrillation) ([place]) Episode of A. fib with RVR May 2022.  Patient appears to be in sinus rhythm today.  Continue follow-up with cardiology.  Continue current maintenance regimen.     Rexene Edison, NP 07/13/2021

## 2021-07-19 NOTE — Progress Notes (Signed)
Reviewed and agree with assessment/plan.   Chesley Mires, MD [facility] Pulmonary/Critical Care 07/19/2021, 8:23 AM Pager:  [phone]

## 2021-07-28 ENCOUNTER — Other Ambulatory Visit: Payer: Self-pay | Admitting: Surgery

## 2021-08-11 DIAGNOSIS — Z8616 Personal history of COVID-19: Secondary | ICD-10-CM

## 2021-08-11 HISTORY — DX: Personal history of COVID-19: Z86.16

## 2021-08-13 ENCOUNTER — Other Ambulatory Visit: Payer: Self-pay

## 2021-08-13 ENCOUNTER — Encounter
Admission: RE | Admit: 2021-08-13 | Discharge: 2021-08-13 | Disposition: A | Discharge status: Home / Self Care | Payer: BC Managed Care – PPO | Source: Ambulatory Visit | Attending: Surgery | Admitting: Surgery

## 2021-08-13 NOTE — Patient Instructions (Addendum)
Your procedure is scheduled on: 08/24/2021 Report to the Registration Desk on the 1st floor of the [facility]. To find out your arrival time, please call [phone] between 1PM - 3PM on: 08/23/2021   REMEMBER: Instructions that are not followed completely may result in serious medical risk, up to and including death; or upon the discretion of your surgeon and anesthesiologist your surgery may need to be rescheduled.  Do not eat food after midnight the night before surgery.  No gum chewing, lozengers or hard candies.  You may however, drink CLEAR liquids up to 2 hours before you are scheduled to arrive for your surgery. Do not drink anything within 2 hours of your scheduled arrival time.  Clear liquids include: - water  - apple juice without pulp - gatorade (not RED, PURPLE, OR BLUE) - black coffee or tea (Do NOT add milk or creamers to the coffee or tea) Do NOT drink anything that is not on this list.   In addition, your doctor has ordered for you to drink the provided  Pre-Surgery Clear Carbohydrate Drink  Drinking this carbohydrate drink up to two hours before surgery helps to reduce insulin resistance and improve patient outcomes. Please complete drinking 2 hours prior to scheduled arrival time.  TAKE THESE MEDICATIONS THE MORNING OF SURGERY WITH A SIP OF WATER:  Omeprazole (take one the night before and one on the morning of surgery - helps to prevent nausea after surgery.) Diltiazem rosuvastatin  Use inhalers on the day of surgery if needed and bring to the hospital.    Follow recommendations from Cardiologist, Pulmonologist or PCP regarding stopping ,Eliquis.                                          Last dose of Eliquis date: ____________________  One week prior to surgery: Stop Anti-inflammatories (NSAIDS) such as Advil, Aleve, Ibuprofen, Motrin, Naproxen, Naprosyn and Aspirin based products such as Excedrin, Goodys Powder, BC Powder.  Stop ANY OVER THE COUNTER  supplements until after surgery like melatonin, multivitamin, vit b-12,  You may however, continue to take Tylenol if needed for pain up until the day of surgery.  No Alcohol for 24 hours before or after surgery.  No Smoking including e-cigarettes for 24 hours prior to surgery.  No chewable tobacco products for at least 6 hours prior to surgery.  No nicotine patches on the day of surgery.  Do not use any "recreational" drugs for at least a week prior to your surgery.  Please be advised that the combination of cocaine and anesthesia may have negative outcomes, up to and including death. If you test positive for cocaine, your surgery will be cancelled.  On the morning of surgery brush your teeth with toothpaste and water, you may rinse your mouth with mouthwash if you wish. Do not swallow any toothpaste or mouthwash.  Do not wear jewelry,.  Do not wear lotions/ointments , powders, or perfumes.   Do not shave body from the neck down 48 hours prior to surgery just in case you cut yourself which could leave a site for infection.  Also, freshly shaved skin may become irritated if using the CHG soap.  Contact lenses, hearing aids and dentures may not be worn into surgery.  Do not bring valuables to the hospital. [facility] is not responsible for any missing/lost belongings or valuables.   Use CHG  Soap or wipes as directed on instruction sheet.   Notify your doctor if there is any change in your medical condition (cold, fever, infection).  Wear comfortable clothing (specific to your surgery type) to the hospital.  After surgery, you can help prevent lung complications by doing breathing exercises.  Take deep breaths and cough every 1-2 hours. Your doctor may order a device called an Incentive Spirometer to help you take deep breaths.  If you are being admitted to the hospital overnight, leave your suitcase in the car. After surgery it may be brought to your room.  If you are being  discharged the day of surgery, you will not be allowed to drive home. You will need a responsible adult (18 years or older) to drive you home and stay with you that night.   If you are taking public transportation, you will need to have a responsible adult (18 years or older) with you. Please confirm with your physician that it is acceptable to use public transportation.   Please call the [place] Dept. at [phone] if you have any questions about these instructions.  Surgery Visitation Policy:  Patients undergoing a surgery or procedure may have one family member or support person with them as long as that person is not COVID-19 positive or experiencing its symptoms.  That person may remain in the waiting area during the procedure.  Inpatient Visitation:    Visiting hours are 7 a.m. to 8 p.m. Inpatients will be allowed two visitors daily. The visitors may change each day during the patient's stay. No visitors under the age of 80. Any visitor under the age of 1 must be accompanied by an adult. The visitor must pass COVID-19 screenings, use hand sanitizer when entering and exiting the patient's room and wear a mask at all times, including in the patient's room. Patients must also wear a mask when staff or their visitor are in the room. Masking is required regardless of vaccination status.

## 2021-08-13 NOTE — Pre-Procedure Instructions (Addendum)
Pt. was positive for Covid home test resulted Wednesday, 08/11/2021 . Per protocol , he can't come in for lab work until after 10 days out. Instructed pt that someone will call him next week to follow up with coming to lab work (pre admission testing) and notify him for possible changes to surgery date.  His original surgery date is Sept 6, 2022. The soonest he can come for lab work is Sept 6,2022. So either way, he can't get lab work done in time. He also takes Eliquis that needs to be addressed.

## 2021-08-16 ENCOUNTER — Encounter: Payer: Self-pay | Admitting: Surgery

## 2021-08-16 ENCOUNTER — Telehealth: Payer: Self-pay | Admitting: *Deleted

## 2021-08-16 NOTE — Telephone Encounter (Signed)
Request for pre-operative cardiac clearance Received: Today Karen Kitchens, NP  P Cv Div Preop Callback Request for pre-operative cardiac clearance:     1. What type of surgery is being performed?  SHOULDER ARTHROSCOPY WITH DEBRIDEMENT, DECOMPRESSION, AND  ROTATOR CUFF REPAIR   2. When is this surgery scheduled?  08/24/2021     3. Are there any medications that need to be held prior to surgery?  APIXABAN   4. Practice name and name of physician performing surgery?  Performing surgeon: Dr. Milagros Evener, MD  Requesting clearance: Honor Loh, FNP-C       5. Anesthesia type (none, local, MAC, general)? General   6. What is the office phone and fax number?    Phone: [phone]  Fax: [phone]   ATTENTION: Unable to create telephone message as per your standard workflow. Directed by [facility] providers to send requests for cardiac clearance to this pool for appropriate distribution to provider covering pre-operative clearances.   Honor Loh, MSN, APRN, FNP-C, CEN  [facility]  Peri-operative Services Nurse Practitioner  Phone: [phone]  08/16/21 1:19 PM

## 2021-08-16 NOTE — Telephone Encounter (Signed)
Pharmacy, can you please comment on how long Eliquis can be held for upcoming procedure?  Thank you! 

## 2021-08-17 ENCOUNTER — Encounter: Payer: Self-pay | Admitting: Surgery

## 2021-08-17 MED ORDER — APIXABAN 5 MG PO TABS
5.0000 mg | ORAL_TABLET | Freq: Two times a day (BID) | ORAL | 1 refills | Status: DC
Start: 2021-08-17 — End: 2022-01-03

## 2021-08-17 NOTE — Progress Notes (Addendum)
Perioperative Services  Pre-Admission/Anesthesia Testing Clinical Review  Date: 08/18/21  Patient Demographics:  Name: Connor Freeman DOB:   March 20, 1956 MRN:   [id number]  Planned Surgical Procedure(s):    Case: [id number] Date/Time: 08/24/21 1023   Procedure: SHOULDER ARTHROSCOPY WITH DEBRIDEMENT, DECOMPRESSION, AND  ROTATOR CUFF REPAIR (Right: Shoulder)   Anesthesia type: Choice   Pre-op diagnosis:      Traumatic complete tear of right rotator cuff, initial encounter S46.011A     Rotator cuff tendinitis, right M75.81     Injury of tendon of long head of right biceps, initial encounter S46.101A   Location: [place] [facility]   Surgeons: Corky Mull, MD     NOTE: Available PAT nursing documentation and vital signs have been reviewed. Clinical nursing staff has updated patient's PMH/PSHx, current medication list, and drug allergies/intolerances to ensure comprehensive history available to assist in medical decision making as it pertains to the aforementioned surgical procedure and anticipated anesthetic course. Extensive review of available clinical information performed. [place] [facility] and PSHx updated with any diagnoses/procedures that  may have been inadvertently omitted during his intake with the pre-admission testing department's nursing staff.  Clinical Discussion:  Connor Freeman is a 65 y.o. male who is submitted for pre-surgical anesthesia review and clearance prior to him undergoing the above procedure. Patient has never been a smoker. Pertinent PMH includes: atrial fibrillation, RBBB, HTN, HLD, T2DM, GERD (on daily PPI), OA, sleep difficulties (uses melatonin).  Patient is followed by cardiology Connor Lah, MD). He was last seen in the cardiology clinic on 06/07/2021; notes reviewed.  At the time of his clinic visit, patient doing well overall from a cardiovascular perspective.  He denied any episodes of chest pain, shortness breath, PND, orthopnea,  significant peripheral edema, palpitations, vertiginous symptoms, or presyncope/syncope.  PMH significant for cardiovascular diagnoses.  Patient in for RIGHT rotator cuff repair back in 04/2021.  Preoperative ECG revealed new onset atrial fibrillation with RVR.  Patient managed with diltiazem and started on chronic anticoagulation (apixaban).  TTE performed on 04/27/2021 revealed a normal left ventricular systolic function with an EF of 60-65%.  There were no regional wall motion abnormalities.  Mild LVH was noted.  There were no observed valvular abnormalities.  Myocardial perfusion imaging study performed on 05/24/2021 revealed a normal left ventricular systolic function of 76-81%.  CT attenuation images showed minimal coronary calcifications.  There were no ST or T wave changes noted during stress.  Study determined to be low risk and normal.  CHA2DS2-VASc Score = 2 (HTN, T2DM).  He is chronically anticoagulated using daily apixaban; compliant with therapy with no evidence of GI bleeding.  Rate and rhythm controlled on oral diltiazem.  Blood pressure well controlled at 128/80.  He is on a statin for his HLD.  T2DM well controlled with diet lifestyle modification; last Hgb A1c was 6.3% when checked on 08/11/2021.  Of note, patient has been diagnosed with moderate OSAH syndrome, however was not using nocturnal PAP therapy at that time.  Patient was referred to pulmonology Halford Chessman, MD), whom he saw in consult on 07/13/2021; notes reviewed.  Nocturnal PAP therapy was ordered.  Patient to follow-up with pulmonary medicine in approximately 2 months.  No changes were made to his medication regimen during his cardiology visit.  Patient to follow-up with outpatient cardiology in 6 months or sooner if needed.  Patient is scheduled to undergo an elective shoulder arthroscopy with debridement, decompression, and rotator cuff repair  on 08/24/2021 with Dr. Milagros Evener, MD.  Given patient's past medical history  significant for cardiovascular diagnoses, presurgical cardiac clearance was sought by the performing surgeon's office and PAT team. Per cardiology, "based on patient's past medical history and time since his last clinic visit, patient would be at an overall ACCEPTABLE risk for the planned procedure without further cardiovascular testing or intervention at this time". Again, patient is on chronic anticoagulation therapy using daily apixaban.  He has been instructed on recommendations for holding this medication for 3 days prior to his procedure with plans to restart as soon as postoperative bleeding risk felt to be minimized by primary attending surgeon.  Patient is aware that his last dose apixaban will be on 08/21/2021.  Patient denies previous perioperative complications with anesthesia in the past.  In review of his EMR, there are no records available for review regarding patient's previous procedural/anesthetic courses within the [facility] system.  Vitals with BMI 07/13/2021 06/07/2021 05/07/2021  Height '6\' 0"'  '6\' 0"'  '6\' 1"'   Weight 270 lbs 3 oz 270 lbs 274 lbs  BMI 36.64 53.64 68.03  Systolic 212 248 250  Diastolic 88 80 84  Pulse 69 58 57    Providers/Specialists:   NOTE: Primary physician provider listed below. Patient may have been seen by APP or partner within same practice.   PROVIDER ROLE / SPECIALTY LAST OV  Poggi, Marshall Cork, MD Orthopedics (Surgeon) 04/20/2021  Kirk Ruths, MD Primary Care Provider 06/03/2021  Kate Sable, MD Cardiology 06/07/2021  Chesley Mires, MD Pulmonary Medicine 07/13/2021   Allergies:  Patient has no known allergies.  Current Home Medications:   No current facility-administered medications for this encounter.    acetaminophen (TYLENOL) 325 MG tablet   albuterol (VENTOLIN HFA) 108 (90 Base) MCG/ACT inhaler   Cyanocobalamin (VITAMIN B-12) 5000 MCG SUBL   diclofenac Sodium (VOLTAREN) 1 % GEL   diltiazem (CARDIZEM CD) 240 MG 24 hr capsule    Melatonin 10 MG CAPS   Multiple Vitamin (MULTIVITAMIN WITH MINERALS) TABS tablet   omeprazole (PRILOSEC) 40 MG capsule   OVER THE COUNTER MEDICATION   rosuvastatin (CRESTOR) 40 MG tablet   apixaban (ELIQUIS) 5 MG TABS tablet   History:   Past Medical History:  Diagnosis Date   Arthritis    hands and wrist   Atrial fibrillation ([place])    a. 04/2012 incidentally noted preop shoulder surgery.  CHA2DS2VASc = 2; b. 04/2021 Echo: EF 60-65%.  No rwma. Nl RV size/fxn.  No significant valvular disease.   Chronic anticoagulation    Apixaban   GERD (gastroesophageal reflux disease)    History of 2019 novel coronavirus disease (COVID-19) 08/11/2021   home test (+)   Hyperlipidemia    Hypertension    Obesity    Right bundle branch block    Skin cancer 1990   throat and neck area   Sleep difficulties    takes melatonin   Snores    Type II diabetes mellitus ([place])    Past Surgical History:  Procedure Laterality Date   excision on skin cancer   1990   Family History  Problem Relation Age of Onset   Stroke Mother        died in her 63's   Heart attack Father        died in his 23's   Stroke Brother    Heart defect Brother        s/p valve replacement in his 60s   Social History  Tobacco Use   Smoking status: Never   Smokeless tobacco: Never  Substance Use Topics   Alcohol use: Yes    Comment: 1 beer/wk   Drug use: Never    Pertinent Clinical Results:  LABS: Labs reviewed: Acceptable for surgery.  Lab Results  Component Value Date   WBC 7.6 04/28/2021   HGB 15.8 04/28/2021   HCT 47.0 04/28/2021   MCV 90.0 04/28/2021   PLT 208 04/28/2021    Ref Range & Units 08/11/2021  Glucose 70 - 110 mg/dL 93   Sodium 136 - 145 mmol/L 139   Potassium 3.6 - 5.1 mmol/L 3.9   Chloride 97 - 109 mmol/L 104   Carbon Dioxide (CO2) 22.0 - 32.0 mmol/L 26.3   Urea Nitrogen (BUN) 7 - 25 mg/dL 15   Creatinine 0.7 - 1.3 mg/dL 0.8   Glomerular Filtration Rate (eGFR), MDRD Estimate >60  mL/min/1.73sq m 98   Calcium 8.7 - 10.3 mg/dL 9.2   AST  8 - 39 U/L 24   ALT  6 - 57 U/L 27   Alk Phos (alkaline Phosphatase) 34 - 104 U/L 58   Albumin 3.5 - 4.8 g/dL 4.4   Bilirubin, Total 0.3 - 1.2 mg/dL 0.5   Protein, Total 6.1 - 7.9 g/dL 6.8   A/G Ratio 1.0 - 5.0 gm/dL 1.8   Resulting Agency  [facility] - LAB  Specimen Collected: 08/11/21 12:49 Last Resulted: 08/11/21 13:28  Received From: [facility]  Result Received: 08/13/21 15:22    Ref Range & Units 08/11/2021  Hemoglobin A1C 4.2 - 5.6 % 6.3 High    Average Blood Glucose (Calc) mg/dL 134   Resulting Agency  [facility] - [facility]    ECG: Date: 06/07/2021 Time ECG obtained: 1112 AM Rate: 59 bpm Rhythm:  Normal sinus rhythm; RBBB Axis (leads I and aVF): Normal Intervals: PR 152 ms. QRS 124 ms. QTc 463 ms. ST segment and T wave changes: No evidence of acute ST segment elevation or depression Comparison: Similar to previous tracing obtained on 05/07/2021   IMAGING / PROCEDURES: LEXISCAN performed on 05/24/2021 Normal left ventricular systolic function with an EF of 55 to 65% No ST or T wave abnormalities noted during stress  CT attenuation images showed minimal coronary calcifications Normal low risk study  TRANSTHORACIC ECHOCARDIOGRAM performed in 04/27/2021 Left ventricular ejection fraction, by estimation, is 60 to 65%. The left ventricle has normal function. The left ventricle has no regional  wall motion abnormalities. There is mild left ventricular hypertrophy. Left ventricular diastolic parameters are indeterminate.  Right ventricular systolic function is normal. The right ventricular size is normal.  The mitral valve is normal in structure. No evidence of mitral valve regurgitation.  The aortic valve is tricuspid. Aortic valve regurgitation is not visualized.  The inferior vena cava is normal in size with greater than 50% respiratory variability, suggesting right atrial pressure  of 3 mmHg.  DIAGNOSTIC RADIOGRAPHS CHEST 2 VIEW performed on 04/27/2021 Lungs are clear Heart size is normal No PTX or pleural effusion No focal abnormalities No acute cardiopulmonary disease  MRI SHOULDER RIGHT WITHOUT CONTRAST performed on 01/19/2021 Full-thickness retracted supraspinatus tendon tear in the critical zone region Small full-thickness tear involving the upper fibers of the subscapularis tendon.  Associated multiple fatty atrophy of the subscapularis muscle Unable to identify the intra-articular portion of the long head biceps tendon.  Is suspected that is torn from its attached site. Mild glenohumeral joint degenerative changes with moderate sized  joint effusion and mild synovitis.  Impression and Plan:  Connor Freeman has been referred for pre-anesthesia review and clearance prior to him undergoing the planned anesthetic and procedural courses. Available labs, pertinent testing, and imaging results were personally reviewed by me. This patient has been appropriately cleared by cardiology with an overall ACCEPTABLE risk of significant perioperative cardiovascular complications.  Based on clinical review performed today (08/18/21), barring any significant acute changes in the patient's overall condition, it is anticipated that he will be able to proceed with the planned surgical intervention. Any acute changes in clinical condition may necessitate his procedure being postponed and/or cancelled. Patient will meet with anesthesia team (MD and/or CRNA) on the day of his procedure for preoperative evaluation/assessment. Questions regarding anesthetic course will be fielded at that time.   Pre-surgical instructions were reviewed with the patient during his PAT appointment and questions were fielded by PAT clinical staff. Patient was advised that if any questions or concerns arise prior to his procedure then he should return a call to PAT and/or his surgeon's office to discuss.  Honor Loh, MSN, APRN, FNP-C, CEN [facility]  Peri-operative Services Nurse Practitioner Phone: [phone] Fax: [phone] 08/18/21 10:33 AM  NOTE: This note has been prepared using Dragon dictation software. Despite my best ability to proofread, there is always the potential that unintentional transcriptional errors may still occur from this process.

## 2021-08-17 NOTE — Telephone Encounter (Signed)
Patient with diagnosis of afib on Eliquis for anticoagulation.    Procedure: shoulder arthroscopy with debridement, decompression, and rotator cuff repair  Date of procedure: 08/24/21  CHA2DS2-VASc Score = 2  This indicates a 2.2% annual risk of stroke. The patient's score is based upon: CHF History: No HTN History: Yes Diabetes History: Yes Stroke History: No Vascular Disease History: No Age Score: 0 Gender Score: 0   CrCl >142m/min Platelet count 208K  Per office protocol, patient can hold Eliquis for 3 days prior to procedure.

## 2021-08-17 NOTE — Pre-Procedure Instructions (Addendum)
Patient Instructions for surgery was discussed with patient over the phone 08/17/2021. He also agreed to view the instructions on MYchart [facility] account. Per patient , Surgeon's office told him that last dose of Eliquis is Sept 3, 2022.

## 2021-08-17 NOTE — Patient Instructions (Addendum)
Your procedure is scheduled on: 08/24/2021 Report to the Registration Desk on the 1st floor of the [facility]. To find out your arrival time, please call [phone] between 1PM - 3PM on:08/23/2021  REMEMBER: Instructions that are not followed completely may result in serious medical risk, up to and including death; or upon the discretion of your surgeon and anesthesiologist your surgery may need to be rescheduled.  Do not eat food after midnight the night before surgery.  No gum chewing, lozengers or hard candies.  You may however, drink CLEAR liquids up to 2 hours before you are scheduled to arrive for your surgery. Do not drink anything within 2 hours of your scheduled arrival time.  Clear liquids include: - water  - apple juice without pulp - gatorade (not RED, PURPLE, OR BLUE) - black coffee or tea (Do NOT add milk or creamers to the coffee or tea) Do NOT drink anything that is not on this list.   TAKE THESE MEDICATIONS THE MORNING OF SURGERY WITH A SIP OF WATER: Omeprazole (take one the night before and one on the morning of surgery - helps to prevent nausea after surgery.) Crestor (rosuvastatin) Cardizem  Use inhalers on the day of surgery and bring to the hospital.    Follow recommendations from Cardiologist, Pulmonologist or PCP or surgeon regarding stopping   Eliquis.  Please remember the stopping date per your surgeon.  One week prior to surgery: Stop Anti-inflammatories (NSAIDS) such as Advil, Aleve, Ibuprofen, Motrin, Naproxen, Naprosyn and Aspirin based products such as Excedrin, Goodys Powder, BC Powder and diclofenac Stop ANY OVER THE COUNTER supplements until after surgery like B-12, multivitamin,melatonin,over the counter medication,  You may however, continue to take Tylenol if needed for pain up until the day of surgery.  No Alcohol for 24 hours before or after surgery.  No Smoking including e-cigarettes for 24 hours prior to surgery.  No chewable tobacco  products for at least 6 hours prior to surgery.  No nicotine patches on the day of surgery.  Do not use any "recreational" drugs for at least a week prior to your surgery.  Please be advised that the combination of cocaine and anesthesia may have negative outcomes, up to and including death. If you test positive for cocaine, your surgery will be cancelled.  On the morning of surgery brush your teeth with toothpaste and water, you may rinse your mouth with mouthwash if you wish. Do not swallow any toothpaste or mouthwash.  Do not wear jewelry, make-up, hairpins, clips or nail polish.  Do not wear lotions, powders, or perfumes.   Do not shave body from the neck down 48 hours prior to surgery just in case you cut yourself which could leave a site for infection.  Also, freshly shaved skin may become irritated if using the CHG soap.  Contact lenses, hearing aids and dentures may not be worn into surgery.  Do not bring valuables to the hospital. [facility] is not responsible for any missing/lost belongings or valuables.   Use CHG Soap or wipes as directed on instruction sheet.  Notify your doctor if there is any change in your medical condition (cold, fever, infection).  Wear comfortable clothing (specific to your surgery type) to the hospital.  After surgery, you can help prevent lung complications by doing breathing exercises.  Take deep breaths and cough every 1-2 hours. Your doctor may order a device called an Incentive Spirometer to help you take deep breaths. When coughing or sneezing, hold  a pillow firmly against your incision with both hands. This is called "splinting." Doing this helps protect your incision. It also decreases belly discomfort.  If you are being admitted to the hospital overnight, leave your suitcase in the car. After surgery it may be brought to your room.  If you are being discharged the day of surgery, you will not be allowed to drive home. You will need a  responsible adult (18 years or older) to drive you home and stay with you that night.   If you are taking public transportation, you will need to have a responsible adult (18 years or older) with you. Please confirm with your physician that it is acceptable to use public transportation.   Please call the [facility] Dept. at [phone] if you have any questions about these instructions.  Surgery Visitation Policy:  Patients undergoing a surgery or procedure may have one family member or support person with them as long as that person is not COVID-19 positive or experiencing its symptoms.  That person may remain in the waiting area during the procedure.  Inpatient Visitation:    Visiting hours are 7 a.m. to 8 p.m. Inpatients will be allowed two visitors daily. The visitors may change each day during the patient's stay. No visitors under the age of 79. Any visitor under the age of 85 must be accompanied by an adult. The visitor must pass COVID-19 screenings, use hand sanitizer when entering and exiting the patient's room and wear a mask at all times, including in the patient's room. Patients must also wear a mask when staff or their visitor are in the room. Masking is required regardless of vaccination status.

## 2021-08-17 NOTE — Pre-Procedure Instructions (Signed)
Called patient and left a message to call me back regarding patient instructions for surgery.  Awaiting for a return call

## 2021-08-18 ENCOUNTER — Encounter: Payer: Self-pay | Admitting: Surgery

## 2021-08-18 NOTE — Telephone Encounter (Signed)
This patient has already been cleared. Can you please reach out to see if there is a specific question?

## 2021-08-18 NOTE — Telephone Encounter (Signed)
Hey Ms. Arbie Cookey.... Received your message. I am in and out of the office. Sorry I missed you. RE this patient. His initial procedure was cancelled. Clearance over 30 days. I just wanted updated input to ensure nothing had changed with him. I like to have all of my specialty clearances within 30 days of the procedure to keep my anesthesiologists happy :-). With a blessing from you guys, I will note that clearance stands and that he is ok to proceed. Thanks for reaching out and letting me know.   I will update the pre op provider with message from Honor Loh, NP.

## 2021-08-18 NOTE — Telephone Encounter (Signed)
Patient is returning call regarding his clearance.

## 2021-08-18 NOTE — Telephone Encounter (Signed)
Left VM

## 2021-08-19 NOTE — Telephone Encounter (Signed)
    Patient Name: Connor Freeman  DOB: [birth year] MRN: [id number]  Primary Cardiologist: Kate Sable, MD  Chart reviewed as part of pre-operative protocol coverage. Patient was seen by Dr. Garen Lah in 05/2021 for pre-op evaluation for upcoming shoulder surgery. He was felt to be at acceptable risk for surgery at that time without any additional testing. Surgery got delayed for a couple of months. Therefore, I called patient today to confirm that he has had no new cardiac symptoms. Patient reported he is doing well since last visit with no new cardiac symptoms. Given past medical history and time since last visit, based on ACC/AHA guidelines, Connor Freeman would be at acceptable risk for the planned procedure without further cardiovascular testing.   Per Pharmacy and office protocol, patient can hold Eliquis for 3 days prior to procedure. Please restart this as soon as possible postoperatively.   I will route this recommendation to the requesting party via Epic fax function and remove from pre-op pool.  Please call with questions.  Darreld Mclean, PA-C 08/19/2021, 11:45 AM

## 2021-08-24 ENCOUNTER — Ambulatory Visit: Payer: BC Managed Care – PPO

## 2021-08-24 ENCOUNTER — Other Ambulatory Visit: Payer: Self-pay

## 2021-08-24 ENCOUNTER — Encounter
Admission: RE | Disposition: A | Discharge status: Home / Self Care | Payer: Self-pay | Source: Home / Self Care | Attending: Surgery

## 2021-08-24 ENCOUNTER — Ambulatory Visit: Payer: BC Managed Care – PPO | Admitting: Urgent Care

## 2021-08-24 ENCOUNTER — Encounter: Payer: Self-pay | Admitting: Surgery

## 2021-08-24 ENCOUNTER — Ambulatory Visit
Admission: RE | Admit: 2021-08-24 | Discharge: 2021-08-24 | Disposition: A | Discharge status: Home / Self Care | Payer: BC Managed Care – PPO | Attending: Surgery | Admitting: Surgery

## 2021-08-24 DIAGNOSIS — I4891 Unspecified atrial fibrillation: Secondary | ICD-10-CM | POA: Insufficient documentation

## 2021-08-24 DIAGNOSIS — M75121 Complete rotator cuff tear or rupture of right shoulder, not specified as traumatic: Secondary | ICD-10-CM | POA: Diagnosis present

## 2021-08-24 DIAGNOSIS — Z85820 Personal history of malignant melanoma of skin: Secondary | ICD-10-CM | POA: Insufficient documentation

## 2021-08-24 DIAGNOSIS — M25811 Other specified joint disorders, right shoulder: Secondary | ICD-10-CM | POA: Insufficient documentation

## 2021-08-24 DIAGNOSIS — Z79899 Other long term (current) drug therapy: Secondary | ICD-10-CM | POA: Insufficient documentation

## 2021-08-24 DIAGNOSIS — Z7901 Long term (current) use of anticoagulants: Secondary | ICD-10-CM | POA: Insufficient documentation

## 2021-08-24 DIAGNOSIS — X58XXXA Exposure to other specified factors, initial encounter: Secondary | ICD-10-CM | POA: Diagnosis not present

## 2021-08-24 DIAGNOSIS — M7581 Other shoulder lesions, right shoulder: Secondary | ICD-10-CM | POA: Insufficient documentation

## 2021-08-24 DIAGNOSIS — Z419 Encounter for procedure for purposes other than remedying health state, unspecified: Secondary | ICD-10-CM

## 2021-08-24 HISTORY — DX: Sleep disorder, unspecified: G47.9

## 2021-08-24 HISTORY — DX: Long term (current) use of anticoagulants: Z79.01

## 2021-08-24 HISTORY — PX: BICEPT TENODESIS: SHX5116

## 2021-08-24 HISTORY — PX: SHOULDER ARTHROSCOPY WITH OPEN ROTATOR CUFF REPAIR: SHX6092

## 2021-08-24 LAB — GLUCOSE, CAPILLARY
Glucose-Capillary: 152 mg/dL — ABNORMAL HIGH (ref 70–99)
Glucose-Capillary: 125 mg/dL — ABNORMAL HIGH (ref 70–99)

## 2021-08-24 SURGERY — ARTHROSCOPY, SHOULDER WITH REPAIR, ROTATOR CUFF, OPEN
Anesthesia: General | Site: Shoulder | Laterality: Right

## 2021-08-24 MED ORDER — OXYCODONE HCL 5 MG PO TABS: 5.0000 mg | ORAL_TABLET | ORAL | 0 refills | Status: DC | PRN

## 2021-08-24 MED ORDER — CHLORHEXIDINE GLUCONATE 0.12 % MT SOLN
OROMUCOSAL | Status: AC
  Administered 2021-08-24: 15 mL via OROMUCOSAL
  Filled 2021-08-24: qty 15

## 2021-08-24 MED ORDER — EPHEDRINE 5 MG/ML INJ
INTRAVENOUS | Status: AC
  Filled 2021-08-24: qty 5

## 2021-08-24 MED ORDER — FENTANYL CITRATE PF 50 MCG/ML IJ SOSY
PREFILLED_SYRINGE | INTRAMUSCULAR | Status: AC
  Filled 2021-08-24: qty 1

## 2021-08-24 MED ORDER — KETOROLAC TROMETHAMINE 15 MG/ML IJ SOLN
INTRAMUSCULAR | Status: AC
  Administered 2021-08-24: 15 mg via INTRAVENOUS
  Filled 2021-08-24: qty 1

## 2021-08-24 MED ORDER — SODIUM CHLORIDE 0.9 % IR SOLN
Status: DC | PRN
Start: 2021-08-24 — End: 2021-08-24
  Administered 2021-08-24: 4500 mL

## 2021-08-24 MED ORDER — MIDAZOLAM HCL 2 MG/2ML IJ SOLN
INTRAMUSCULAR | Status: AC
  Filled 2021-08-24: qty 2

## 2021-08-24 MED ORDER — ACETAMINOPHEN 10 MG/ML IV SOLN
INTRAVENOUS | Status: DC | PRN
Start: 2021-08-24 — End: 2021-08-24
  Administered 2021-08-24: 1000 mg via INTRAVENOUS

## 2021-08-24 MED ORDER — LIDOCAINE HCL (PF) 1 % IJ SOLN
INTRAMUSCULAR | Status: AC
  Filled 2021-08-24: qty 5

## 2021-08-24 MED ORDER — ROCURONIUM BROMIDE 100 MG/10ML IV SOLN
INTRAVENOUS | Status: DC | PRN
  Administered 2021-08-24: 5 mg via INTRAVENOUS
  Administered 2021-08-24: 20 mg via INTRAVENOUS
  Administered 2021-08-24: 45 mg via INTRAVENOUS

## 2021-08-24 MED ORDER — FENTANYL CITRATE (PF) 100 MCG/2ML IJ SOLN: 25.0000 ug | INTRAMUSCULAR | Status: DC | PRN

## 2021-08-24 MED ORDER — IPRATROPIUM-ALBUTEROL 0.5-2.5 (3) MG/3ML IN SOLN
RESPIRATORY_TRACT | Status: AC
  Administered 2021-08-24: 3 mL via RESPIRATORY_TRACT
  Filled 2021-08-24: qty 3

## 2021-08-24 MED ORDER — IPRATROPIUM-ALBUTEROL 0.5-2.5 (3) MG/3ML IN SOLN: 3.0000 mL | Freq: Once | RESPIRATORY_TRACT | Status: AC

## 2021-08-24 MED ORDER — ROCURONIUM BROMIDE 10 MG/ML (PF) SYRINGE
PREFILLED_SYRINGE | INTRAVENOUS | Status: AC
  Filled 2021-08-24: qty 10

## 2021-08-24 MED ORDER — BUPIVACAINE HCL (PF) 0.5 % IJ SOLN
INTRAMUSCULAR | Status: AC
  Filled 2021-08-24: qty 10

## 2021-08-24 MED ORDER — MIDAZOLAM HCL 2 MG/2ML IJ SOLN
INTRAMUSCULAR | Status: AC
  Administered 2021-08-24: 2 mg via INTRAVENOUS
  Filled 2021-08-24: qty 2

## 2021-08-24 MED ORDER — BUPIVACAINE-EPINEPHRINE 0.5% -1:200000 IJ SOLN
INTRAMUSCULAR | Status: DC | PRN
  Administered 2021-08-24: 30 mL

## 2021-08-24 MED ORDER — LACTATED RINGERS IV SOLN: INTRAVENOUS | Status: DC

## 2021-08-24 MED ORDER — EPINEPHRINE PF 1 MG/ML IJ SOLN
INTRAMUSCULAR | Status: AC
  Filled 2021-08-24: qty 2

## 2021-08-24 MED ORDER — DEXAMETHASONE SODIUM PHOSPHATE 10 MG/ML IJ SOLN
INTRAMUSCULAR | Status: DC | PRN
  Administered 2021-08-24: 5 mg via INTRAVENOUS

## 2021-08-24 MED ORDER — LIDOCAINE HCL (CARDIAC) PF 100 MG/5ML IV SOSY
PREFILLED_SYRINGE | INTRAVENOUS | Status: DC | PRN
  Administered 2021-08-24: 80 mg via INTRAVENOUS

## 2021-08-24 MED ORDER — ACETAMINOPHEN 10 MG/ML IV SOLN
INTRAVENOUS | Status: AC
  Filled 2021-08-24: qty 100

## 2021-08-24 MED ORDER — DEXAMETHASONE SODIUM PHOSPHATE 10 MG/ML IJ SOLN
INTRAMUSCULAR | Status: AC
  Filled 2021-08-24: qty 1

## 2021-08-24 MED ORDER — LIDOCAINE HCL (PF) 1 % IJ SOLN
INTRAMUSCULAR | Status: DC | PRN
  Administered 2021-08-24: 3 mL via SUBCUTANEOUS

## 2021-08-24 MED ORDER — FENTANYL CITRATE (PF) 100 MCG/2ML IJ SOLN
INTRAMUSCULAR | Status: DC | PRN
  Administered 2021-08-24: 50 ug via INTRAVENOUS

## 2021-08-24 MED ORDER — SUGAMMADEX SODIUM 500 MG/5ML IV SOLN
INTRAVENOUS | Status: AC
  Filled 2021-08-24: qty 5

## 2021-08-24 MED ORDER — FENTANYL CITRATE (PF) 100 MCG/2ML IJ SOLN
INTRAMUSCULAR | Status: AC
  Filled 2021-08-24: qty 2

## 2021-08-24 MED ORDER — FENTANYL CITRATE PF 50 MCG/ML IJ SOSY: 50.0000 ug | PREFILLED_SYRINGE | Freq: Once | INTRAMUSCULAR | Status: DC

## 2021-08-24 MED ORDER — GLYCOPYRROLATE 0.2 MG/ML IJ SOLN
INTRAMUSCULAR | Status: AC
  Filled 2021-08-24: qty 1

## 2021-08-24 MED ORDER — BUPIVACAINE LIPOSOME 1.3 % IJ SUSP
INTRAMUSCULAR | Status: DC | PRN
  Administered 2021-08-24: 20 mL via PERINEURAL

## 2021-08-24 MED ORDER — GLYCOPYRROLATE 0.2 MG/ML IJ SOLN
INTRAMUSCULAR | Status: DC | PRN
  Administered 2021-08-24: .2 mg via INTRAVENOUS

## 2021-08-24 MED ORDER — BUPIVACAINE HCL (PF) 0.5 % IJ SOLN
INTRAMUSCULAR | Status: DC | PRN
  Administered 2021-08-24: 5 mL via PERINEURAL

## 2021-08-24 MED ORDER — DEXTROSE 5 % IV SOLN
INTRAVENOUS | Status: DC | PRN
  Administered 2021-08-24: 3 g via INTRAVENOUS

## 2021-08-24 MED ORDER — SUCCINYLCHOLINE CHLORIDE 200 MG/10ML IV SOSY
PREFILLED_SYRINGE | INTRAVENOUS | Status: DC | PRN
  Administered 2021-08-24: 120 mg via INTRAVENOUS

## 2021-08-24 MED ORDER — KETOROLAC TROMETHAMINE 15 MG/ML IJ SOLN: 15.0000 mg | Freq: Once | INTRAMUSCULAR | Status: AC

## 2021-08-24 MED ORDER — MIDAZOLAM HCL 2 MG/2ML IJ SOLN: 1.0000 mg | Freq: Once | INTRAMUSCULAR | Status: DC

## 2021-08-24 MED ORDER — EPHEDRINE SULFATE 50 MG/ML IJ SOLN
INTRAMUSCULAR | Status: DC | PRN
  Administered 2021-08-24: 5 mg via INTRAVENOUS
  Administered 2021-08-24: 10 mg via INTRAVENOUS
  Administered 2021-08-24: 5 mg via INTRAVENOUS

## 2021-08-24 MED ORDER — SUGAMMADEX SODIUM 500 MG/5ML IV SOLN
INTRAVENOUS | Status: DC | PRN
  Administered 2021-08-24: 300 mg via INTRAVENOUS

## 2021-08-24 MED ORDER — 0.9 % SODIUM CHLORIDE (POUR BTL) OPTIME
TOPICAL | Status: DC | PRN
  Administered 2021-08-24: 500 mL

## 2021-08-24 MED ORDER — CHLORHEXIDINE GLUCONATE 0.12 % MT SOLN: 15.0000 mL | Freq: Once | OROMUCOSAL | Status: AC

## 2021-08-24 MED ORDER — BUPIVACAINE-EPINEPHRINE (PF) 0.5% -1:200000 IJ SOLN
INTRAMUSCULAR | Status: AC
  Filled 2021-08-24: qty 30

## 2021-08-24 MED ORDER — BUPIVACAINE LIPOSOME 1.3 % IJ SUSP
INTRAMUSCULAR | Status: AC
  Filled 2021-08-24: qty 20

## 2021-08-24 MED ORDER — ORAL CARE MOUTH RINSE: 15.0000 mL | Freq: Once | OROMUCOSAL | Status: AC

## 2021-08-24 MED ORDER — MIDAZOLAM HCL 2 MG/2ML IJ SOLN: 2.0000 mg | Freq: Once | INTRAMUSCULAR | Status: AC

## 2021-08-24 MED ORDER — PROPOFOL 10 MG/ML IV BOLUS
INTRAVENOUS | Status: AC
  Filled 2021-08-24: qty 20

## 2021-08-24 MED ORDER — SODIUM CHLORIDE 0.9 % IV SOLN
INTRAVENOUS | Status: DC | PRN
  Administered 2021-08-24: 20 ug/min via INTRAVENOUS

## 2021-08-24 MED ORDER — ONDANSETRON HCL 4 MG/2ML IJ SOLN
INTRAMUSCULAR | Status: DC | PRN
  Administered 2021-08-24: 4 mg via INTRAVENOUS

## 2021-08-24 MED ORDER — CEFAZOLIN IN SODIUM CHLORIDE 3-0.9 GM/100ML-% IV SOLN
3.0000 g | INTRAVENOUS | Status: DC
  Filled 2021-08-24: qty 100

## 2021-08-24 MED ORDER — SUCCINYLCHOLINE CHLORIDE 200 MG/10ML IV SOSY
PREFILLED_SYRINGE | INTRAVENOUS | Status: AC
  Filled 2021-08-24: qty 10

## 2021-08-24 MED ORDER — ONDANSETRON HCL 4 MG/2ML IJ SOLN
INTRAMUSCULAR | Status: AC
  Filled 2021-08-24: qty 2

## 2021-08-24 MED ORDER — LIDOCAINE HCL (PF) 2 % IJ SOLN
INTRAMUSCULAR | Status: AC
  Filled 2021-08-24: qty 5

## 2021-08-24 MED ORDER — PROPOFOL 10 MG/ML IV BOLUS
INTRAVENOUS | Status: DC | PRN
  Administered 2021-08-24: 150 mg via INTRAVENOUS

## 2021-08-24 SURGICAL SUPPLY — 56 items
ANCH SUT 2 2.9 2 LD TPR NDL (Anchor) ×2 IMPLANT
ANCH SUT 2 2/0 ABS BRD STRL (Anchor) ×2 IMPLANT
ANCH SUT 5.5 KNTLS (Anchor) ×4 IMPLANT
ANCH SUT Q-FX 2.8 (Anchor) ×4 IMPLANT
ANCHOR ALL-SUT Q-FIX 2.8 (Anchor) ×6 IMPLANT
ANCHOR HEALICOIL REGEN 5.5 (Anchor) ×6 IMPLANT
ANCHOR JUGGERKNOT WTAP NDL 2.9 (Anchor) ×3 IMPLANT
ANCHOR SUT W/ ORTHOCORD (Anchor) ×3 IMPLANT
APL PRP STRL LF DISP 70% ISPRP (MISCELLANEOUS) ×4
BIT DRILL JUGRKNT W/NDL BIT2.9 (DRILL) ×2 IMPLANT
BLADE FULL RADIUS 3.5 (BLADE) ×3 IMPLANT
BUR ACROMIONIZER 4.0 (BURR) ×3 IMPLANT
CANNULA SHAVER 8MMX76MM (CANNULA) ×3 IMPLANT
CHLORAPREP W/TINT 26 (MISCELLANEOUS) ×6 IMPLANT
COVER MAYO STAND REUSABLE (DRAPES) ×3 IMPLANT
DRAPE IMP U-DRAPE 54X76 (DRAPES) ×6 IMPLANT
DRILL JUGGERKNOT W/NDL BIT 2.9 (DRILL) ×3
ELECT CAUTERY BLADE 6.4 (BLADE) ×3 IMPLANT
ELECT REM PT RETURN 9FT ADLT (ELECTROSURGICAL) ×3
ELECTRODE REM PT RTRN 9FT ADLT (ELECTROSURGICAL) ×2 IMPLANT
GAUZE SPONGE 4X4 12PLY STRL (GAUZE/BANDAGES/DRESSINGS) ×3 IMPLANT
GAUZE XEROFORM 1X8 LF (GAUZE/BANDAGES/DRESSINGS) ×3 IMPLANT
GLOVE SRG 8 PF TXTR STRL LF DI (GLOVE) ×2 IMPLANT
GLOVE SURG ENC MOIS LTX SZ7.5 (GLOVE) ×12 IMPLANT
GLOVE SURG ENC MOIS LTX SZ8 (GLOVE) ×12 IMPLANT
GLOVE SURG UNDER LTX SZ8 (GLOVE) ×3 IMPLANT
GLOVE SURG UNDER POLY LF SZ8 (GLOVE) ×3
GOWN STRL REUS W/ TWL LRG LVL3 (GOWN DISPOSABLE) ×6 IMPLANT
GOWN STRL REUS W/ TWL XL LVL3 (GOWN DISPOSABLE) ×2 IMPLANT
GOWN STRL REUS W/TWL LRG LVL3 (GOWN DISPOSABLE) ×9
GOWN STRL REUS W/TWL XL LVL3 (GOWN DISPOSABLE) ×3
GRASPER SUT 15 45D LOW PRO (SUTURE) ×3 IMPLANT
IV LACTATED RINGER IRRG 3000ML (IV SOLUTION) ×6
IV LR IRRIG 3000ML ARTHROMATIC (IV SOLUTION) ×4 IMPLANT
KIT CANNULA 8X76-LX IN CANNULA (CANNULA) ×3 IMPLANT
KIT SUTURE 2.8 Q-FIX DISP (MISCELLANEOUS) ×3 IMPLANT
MANIFOLD NEPTUNE II (INSTRUMENTS) ×6 IMPLANT
MASK FACE SPIDER DISP (MASK) ×3 IMPLANT
MAT ABSORB  FLUID 56X50 GRAY (MISCELLANEOUS) ×1
MAT ABSORB FLUID 56X50 GRAY (MISCELLANEOUS) ×2 IMPLANT
PACK ARTHROSCOPY SHOULDER (MISCELLANEOUS) ×3 IMPLANT
PASSER SUT FIRSTPASS SELF (INSTRUMENTS) ×3 IMPLANT
SLING ARM LRG DEEP (SOFTGOODS) IMPLANT
SLING ULTRA II LG (MISCELLANEOUS) IMPLANT
SPONGE T-LAP 18X18 ~~LOC~~+RFID (SPONGE) ×3 IMPLANT
STAPLER SKIN PROX 35W (STAPLE) ×3 IMPLANT
STRAP SAFETY 5IN WIDE (MISCELLANEOUS) ×3 IMPLANT
SUT ETHIBOND 0 MO6 C/R (SUTURE) ×3 IMPLANT
SUT ULTRABRAID 2 COBRAID 38 (SUTURE) ×3 IMPLANT
SUT VIC AB 2-0 CT1 27 (SUTURE) ×6
SUT VIC AB 2-0 CT1 TAPERPNT 27 (SUTURE) ×4 IMPLANT
TAPE MICROFOAM 4IN (TAPE) ×3 IMPLANT
TUBING CONNECTING 10 (TUBING) ×3 IMPLANT
TUBING INFLOW SET DBFLO PUMP (TUBING) ×3 IMPLANT
WAND WEREWOLF FLOW 90D (MISCELLANEOUS) ×3 IMPLANT
WATER STERILE IRR 500ML POUR (IV SOLUTION) ×3 IMPLANT

## 2021-08-24 NOTE — H&P (Signed)
History of Present Illness: Connor Freeman is a 65 y.o. male who presents today for his history and physical for upcoming right shoulder arthroscopy with debridement, decompression and repair of rotator cuff tears. The patient is scheduled for surgery with Dr. Roland Rack on 08/24/2021. The patient denies any changes in his medical history since he was last evaluated. The patient denies any personal history of heart attack, stroke, asthma or COPD. The patient does have a history of A. fib and does take Eliquis which she has been instructed to stop on 08/21/2021. The patient was scheduled for surgery earlier this year however when he was at the hospital this was when he first went into atrial fibrillation. He denies any falls or trauma affecting right shoulder. Pain score at today's visit is a 2 out of 10 to the right upper extremity.  Past Medical History:  Controlled type 2 diabetes mellitus without complication (123456)   HTN, goal below 140/90 09/17/2015   Obesity, Class II, BMI 35.0-39.9, with comorbidity (09/17/2015)   Pure hypercholesterolemia 09/17/2015   Past Surgical History:  COLONOSCOPY 10/06/2003 (Adenomatous Polyp, FHCC (Sister), FH Colon Polyps (Brother): [facility] 09/2007)   COLONOSCOPY 12/01/2015 (Adenomatous Polyp, FHCC (Sister), FH Colon Polyps (Brother): CBF 11/2020)   EGD 12/01/2015 (No repeat per RTE)   melanoma - skin   Past Family History:  Stroke Mother   Coronary Artery Disease (Blocked arteries around heart) Father   Cancer Sister   Throat cancer Brother   Coronary Artery Disease (Blocked arteries around heart) Brother   Medications:  acetaminophen (TYLENOL) 500 MG tablet Take 500 mg by mouth as needed for Pain   albuterol 90 mcg/actuation inhaler Inhale 2 inhalations into the lungs every 6 (six) hours as needed for Wheezing 18 g 0   apixaban (ELIQUIS) 5 mg tablet Take 1 tablet (5 mg total) by mouth 2 (two) times daily 180 tablet 3   blood glucose diagnostic (ONETOUCH ULTRA TEST) test  strip 1 each (1 strip total) 3 (three) times daily Use as instructed. 100 each 3   cyanocobalamin, vitamin B-12, 5,000 mcg Subl Take 5,000 mcg by mouth once daily   diclofenac (VOLTAREN) 1 % topical gel Apply 4 g topically 4 (four) times daily 300 g 11   diltiazem (TIAZAC) 240 MG ER capsule Take 1 capsule by mouth once daily   lancets (ONETOUCH DELICA LANCETS) Use 1 each 3 (three) times daily Use as instructed. 100 each 3   melatonin 10 mg Cap Take 10 mg by mouth at bedtime as needed   multivitamin capsule Take 1 capsule by mouth once daily.   omeprazole (PRILOSEC) 40 MG DR capsule Take 1 capsule (40 mg total) by mouth once daily 90 capsule 3   rosuvastatin (CRESTOR) 40 MG tablet Take 1 tablet (40 mg total) by mouth once daily 90 tablet 3   Allergies: No Known Allergies   Review of Systems:  A comprehensive 14 point ROS was performed, reviewed by me today, and the pertinent orthopaedic findings are documented in the HPI.  Physical Exam: BP 130/80  Ht 185.4 cm ('6\' 1"'$ )  Wt (!) 123.8 kg (273 lb)  BMI 36.02 kg/m  General/Constitutional: The patient appears to be well-nourished, well-developed, and in no acute distress. Neuro/Psych: Normal mood and affect, oriented to person, place and time. Eyes: Non-icteric. Pupils are equal, round, and reactive to light, and exhibit synchronous movement. ENT: Unremarkable. Lymphatic: No palpable adenopathy. Respiratory: Lungs clear to auscultation, Normal chest excursion, No wheezes and Non-labored breathing Cardiovascular: Regular rate  and rhythm. No murmurs. and No edema, swelling or tenderness, except as noted in detailed exam. Integumentary: No impressive skin lesions present, except as noted in detailed exam. Musculoskeletal: Unremarkable, except as noted in detailed exam.  Right shoulder exam: SKIN: normal SWELLING: none WARMTH: none LYMPH NODES: no adenopathy palpable CREPITUS: none TENDERNESS: Mildly tender over anterolateral shoulder ROM  (active):  Forward flexion: 160 degrees Abduction: 155 degrees Internal rotation: L3 ROM (passive):  Forward flexion: 165 degrees Abduction: 160 degrees  ER/IR at 90 abd: 90 degrees / 50 degrees   He has moderate pain with forward flexion, abduction, internal rotation, and internal rotation at 90 degrees of abduction, especially as he descends through 90 degrees of forward flexion and abduction.   STRENGTH: Forward flexion: 4-4+/5 Abduction: 4/5 External rotation: 4-4+/5 Internal rotation: 4+/5 Pain with RC testing: Mild pain with resisted abduction more so than with resisted forward flexion   STABILITY: Normal   SPECIAL TESTS: Luan Pulling' test: positive, moderate Speed's test: Mildly positive Capsulitis - pain w/ passive ER: no Crossed arm test: Mildly positive Crank: Not evaluated Anterior apprehension: Negative Posterior apprehension: Not evaluated   He is neurovascularly intact to the right upper extremity.   Right Shoulder Imaging, MRI: MRI Shoulder Cartilage: No cartilage abnormality. MRI Shoulder Rotator Cuff: Full-thickness tear of the supraspinatus tendon, possibly extending into the anterior fibers of the infraspinatus tendon. Retracted to the humeral head. Full-thickness tear of the anterior insertional fibers of the subscapularis tendon. MRI Shoulder Labrum / Biceps: Torn and retracted long head of biceps tendon. MRI Shoulder Bone: Normal bone.  Impression: 1. Traumatic complete tear of right rotator cuff. 2. Injury of tendon of long head of right biceps. 3. Rotator cuff tendinitis, right.  Plan:  1. Treatment options were discussed today with the patient. 2. The patient is scheduled for a right shoulder arthroscopy with debridement, decompression and repair of rotator cuff tears. 3. The patient was instructed on the risk and benefits of surgery and wishes to proceed at this time. The patient was offered and received a right slingshot shoulder sling to wear  following surgery. 4. This document will serve as a surgical history and physical for the patient. 5. The patient will follow-up per standard postoperative protocol. They can call the clinic they have any questions, new symptoms develop or symptoms worsen.  The procedure was discussed with the patient, as were the potential risks (including bleeding, infection, nerve and/or blood vessel injury, persistent or recurrent pain, failure of the repair, progression of arthritis, need for further surgery, blood clots, strokes, heart attacks and/or arhythmias, pneumonia, etc.) and benefits. The patient states his understanding and wishes to proceed.   H&P reviewed and patient re-examined. No changes.

## 2021-08-24 NOTE — Anesthesia Procedure Notes (Signed)
Anesthesia Regional Block: Interscalene brachial plexus block   Pre-Anesthetic Checklist: , timeout performed,  Correct Patient, Correct Site, Correct Laterality,  Correct Procedure, Correct Position, site marked,  Risks and benefits discussed,  Surgical consent,  Pre-op evaluation,  At surgeon's request and post-op pain management  Laterality: Right  Prep: chloraprep       Needles:  Injection technique: Single-shot  Needle Type: Echogenic Stimulator Needle     Needle Length: 10cm  Needle Gauge: 20     Additional Needles:   Procedures:,,,, ultrasound used (permanent image in chart),,     Nerve Stimulator or Paresthesia:  Response: biceps flexion  Additional Responses:   Narrative:  Start time: 08/24/2021 9:36 AM End time: 08/24/2021 9:43 AM Injection made incrementally with aspirations every 5 mL.  Performed by: Personally  Anesthesiologist: Molli Barrows, MD  Additional Notes: Functioning IV was confirmed and monitors were applied. . Sterile prep and drape,hand hygiene and sterile gloves were used.  Negative aspiration and negative test dose prior to incremental administration of local anesthetic. The patient tolerated the procedure well.

## 2021-08-24 NOTE — Discharge Instructions (Addendum)
Orthopedic discharge instructions: Keep dressing dry and intact.  May shower after dressing changed on post-op day #4 (Saturday).  Cover staples with Band-Aids after drying off. Apply ice frequently to shoulder or use Polar Care device. Take ibuprofen 600-800 mg TID with meals for 3-5 days, then as necessary. Take oxycodone as prescribed when needed.  May supplement with ES Tylenol if necessary. Resume Eliquis tomorrow morning as prescribed. Keep shoulder immobilizer on at all times except may remove for bathing purposes. Follow-up in 10-14 days or as scheduled.     Interscalene Nerve Block with Exparel   For your surgery you have received an Interscalene Nerve Block with Exparel. Nerve Blocks affect many types of nerves, including nerves that control movement, pain and normal sensation.  You may experience feelings such as numbness, tingling, heaviness, weakness or the inability to move your arm or the feeling or sensation that your arm has "fallen asleep". A nerve block with Exparel can last up to 5 days.  Usually the weakness wears off first.  The tingling and heaviness usually wear off next.  Finally you may start to notice pain.  Keep in mind that this may occur in any order.  Once a nerve block starts to wear off it is usually completely gone within 60 minutes. ISNB may cause mild shortness of breath, a hoarse voice, blurry vision, unequal pupils, or drooping of the face on the same side as the nerve block.  These symptoms will usually resolve with the numbness.  Very rarely the procedure itself can cause mild seizures. If needed, your surgeon will give you a prescription for pain medication.  It will take about 60 minutes for the oral pain medication to become fully effective.  So, it is recommended that you start taking this medication before the nerve block first begins to wear off, or when you first begin to feel discomfort. Take your pain medication only as prescribed.  Pain medication  can cause sedation and decrease your breathing if you take more than you need for the level of pain that you have. Nausea is a common side effect of many pain medications.  You may want to eat something before taking your pain medicine to prevent nausea. After an Interscalene nerve block, you cannot feel pain, pressure or extremes in temperature in the effected arm.  Because your arm is numb it is at an increased risk for injury.  To decrease the possibility of injury, please practice the following:  While you are awake change the position of your arm frequently to prevent too much pressure on any one area for prolonged periods of time.  If you have a cast or tight dressing, check the color or your fingers every couple of hours.  Call your surgeon with the appearance of any discoloration (white or blue). If you are given a sling to wear before you go home, please wear it  at all times until the block has completely worn off.  Do not get up at night without your sling. Please contact [facility] or your surgeon if you do not begin to regain sensation after 7 days from the surgery.  Anesthesia may be contacted by calling the Same Day Surgery Department, Mon. through Fri., 6 am to 4 pm at [phone].   If you experience any other problems or concerns, please contact your surgeon's office. If you experience severe or prolonged shortness of breath go to the nearest emergency department.      Interscalene Nerve Block with  Exparel   For your surgery you have received an Interscalene Nerve Block with Exparel. Nerve Blocks affect many types of nerves, including nerves that control movement, pain and normal sensation.  You may experience feelings such as numbness, tingling, heaviness, weakness or the inability to move your arm or the feeling or sensation that your arm has "fallen asleep". A nerve block with Exparel can last up to 5 days.  Usually the weakness wears off first.  The tingling and heaviness  usually wear off next.  Finally you may start to notice pain.  Keep in mind that this may occur in any order.  Once a nerve block starts to wear off it is usually completely gone within 60 minutes. ISNB may cause mild shortness of breath, a hoarse voice, blurry vision, unequal pupils, or drooping of the face on the same side as the nerve block.  These symptoms will usually resolve with the numbness.  Very rarely the procedure itself can cause mild seizures. If needed, your surgeon will give you a prescription for pain medication.  It will take about 60 minutes for the oral pain medication to become fully effective.  So, it is recommended that you start taking this medication before the nerve block first begins to wear off, or when you first begin to feel discomfort. Take your pain medication only as prescribed.  Pain medication can cause sedation and decrease your breathing if you take more than you need for the level of pain that you have. Nausea is a common side effect of many pain medications.  You may want to eat something before taking your pain medicine to prevent nausea. After an Interscalene nerve block, you cannot feel pain, pressure or extremes in temperature in the effected arm.  Because your arm is numb it is at an increased risk for injury.  To decrease the possibility of injury, please practice the following:  While you are awake change the position of your arm frequently to prevent too much pressure on any one area for prolonged periods of time.  If you have a cast or tight dressing, check the color or your fingers every couple of hours.  Call your surgeon with the appearance of any discoloration (white or blue). If you are given a sling to wear before you go home, please wear it  at all times until the block has completely worn off.  Do not get up at night without your sling. Please contact [facility] or your surgeon if you do not begin to regain sensation after 7 days from the  surgery.  Anesthesia may be contacted by calling the Same Day Surgery Department, Mon. through Fri., 6 am to 4 pm at [phone].   If you experience any other problems or concerns, please contact your surgeon's office If you experience severe or prolonged shortness of breath go to the nearest emergency department.    AMBULATORY SURGERY  DISCHARGE INSTRUCTIONS   The drugs that you were given will stay in your system until tomorrow so for the next 24 hours you should not:  Drive an automobile Make any legal decisions Drink any alcoholic beverage   You may resume regular meals tomorrow.  Today it is better to start with liquids and gradually work up to solid foods.  You may eat anything you prefer, but it is better to start with liquids, then soup and crackers, and gradually work up to solid foods.   Please notify your doctor immediately if you have any unusual  bleeding, trouble breathing, redness and pain at the surgery site, drainage, fever, or pain not relieved by medication.    Additional Instructions:  Please contact your physician with any problems or Same Day Surgery at [phone], Monday through Friday 6 am to 4 pm, or [place] at [facility] number at [phone].

## 2021-08-24 NOTE — Anesthesia Procedure Notes (Signed)
Procedure Name: Intubation Date/Time: 08/24/2021 10:43 AM Performed by: Lowry Bowl, CRNA Pre-anesthesia Checklist: Patient identified, Emergency Drugs available, Suction available and Patient being monitored Patient Re-evaluated:Patient Re-evaluated prior to induction Oxygen Delivery Method: Circle system utilized Preoxygenation: Pre-oxygenation with 100% oxygen Induction Type: IV induction Ventilation: Mask ventilation without difficulty Laryngoscope Size: McGraph and 4 Grade View: Grade I Tube type: Oral Tube size: 7.5 mm Number of attempts: 1 Airway Equipment and Method: Stylet and Video-laryngoscopy Placement Confirmation: ETT inserted through vocal cords under direct vision, positive ETCO2 and breath sounds checked- equal and bilateral Secured at: 22 cm Tube secured with: Tape Dental Injury: Teeth and Oropharynx as per pre-operative assessment

## 2021-08-24 NOTE — Transfer of Care (Signed)
Immediate Anesthesia Transfer of Care Note  Patient: Connor Freeman  Procedure(s) Performed: SHOULDER ARTHROSCOPY WITH DEBRIDEMENT, DECOMPRESSION, AND  ROTATOR CUFF REPAIR (Right: Shoulder) BICEPS TENODESIS  Patient Location: PACU  Anesthesia Type:General  Level of Consciousness: awake, drowsy and patient cooperative  Airway & Oxygen Therapy: Patient Spontanous Breathing  Post-op Assessment: Report given to RN and Post -op Vital signs reviewed and stable  Post vital signs: Reviewed and stable  Last Vitals:  Vitals Value Taken Time  BP 124/79 08/24/21 1247  Temp 36.3 C 08/24/21 1247  Pulse 69 08/24/21 1256  Resp 17 08/24/21 1252  SpO2 91 % 08/24/21 1256  Vitals shown include unvalidated device data.  Last Pain:  Vitals:   08/24/21 1247  TempSrc:   PainSc: 0-No pain         Complications: No notable events documented.

## 2021-08-24 NOTE — Anesthesia Preprocedure Evaluation (Addendum)
Anesthesia Evaluation  Patient identified by MRN, date of birth, ID band Patient awake    Reviewed: Allergy & Precautions, H&P , NPO status , Patient's Chart, lab work & pertinent test results, reviewed documented beta blocker date and time   Airway Mallampati: III  TM Distance: >3 FB Neck ROM: full    Dental  (+) Poor Dentition, Partial Upper   Pulmonary sleep apnea and Continuous Positive Airway Pressure Ventilation ,    Pulmonary exam normal        Cardiovascular Exercise Tolerance: Poor hypertension, On Medications negative cardio ROS Normal cardiovascular exam+ dysrhythmias Atrial Fibrillation  Rhythm:regular Rate:Normal     Neuro/Psych negative neurological ROS  negative psych ROS   GI/Hepatic negative GI ROS, Neg liver ROS, GERD  Medicated,  Endo/Other  negative endocrine ROSdiabetes, Well Controlled, Oral Hypoglycemic AgentsMorbid obesity  Renal/GU negative Renal ROS  negative genitourinary   Musculoskeletal  (+) Arthritis ,   Abdominal   Peds  Hematology negative hematology ROS (+)   Anesthesia Other Findings Past Medical History: No date: Arthritis     Comment:  hands and wrist No date: Atrial fibrillation ([place])     Comment:  a. 04/2012 incidentally noted preop shoulder surgery.                CHA2DS2VASc = 2; b. 04/2021 Echo: EF 60-65%.  No rwma. Nl               RV size/fxn.  No significant valvular disease. No date: Chronic anticoagulation     Comment:  Apixaban No date: GERD (gastroesophageal reflux disease) 08/11/2021: History of 2019 novel coronavirus disease (COVID-19)     Comment:  home test (+) No date: Hyperlipidemia No date: Hypertension No date: Obesity No date: Right bundle branch block 1990: Skin cancer     Comment:  throat and neck area No date: Sleep difficulties     Comment:  takes melatonin No date: Snores No date: Type II diabetes mellitus ([place]) Past Surgical History: 1990:  excision on skin cancer  BMI    Body Mass Index: 36.62 kg/m     Reproductive/Obstetrics negative OB ROS                            Anesthesia Physical Anesthesia Plan  ASA: 3  Anesthesia Plan: General   Post-op Pain Management:  Regional for Post-op pain   Induction:   PONV Risk Score and Plan: 3  Airway Management Planned: Video Laryngoscope Planned  Additional Equipment:   Intra-op Plan:   Post-operative Plan:   Informed Consent: I have reviewed the patients History and Physical, chart, labs and discussed the procedure including the risks, benefits and alternatives for the proposed anesthesia with the patient or authorized representative who has indicated his/her understanding and acceptance.     Dental Advisory Given  Plan Discussed with: CRNA  Anesthesia Plan Comments:        Anesthesia Quick Evaluation

## 2021-08-24 NOTE — Op Note (Signed)
08/24/2021  12:43 PM  Patient:   Connor Freeman  Pre-Op Diagnosis:   Impingement/tendinopathy with traumatic full-thickness rotator cuff tear and biceps tendinopathy, right shoulder.  Post-Op Diagnosis:   Impingement/tendinopathy with rotator cuff tear, degenerative labral fraying, and biceps tendinopathy, right shoulder.  Procedure:   Extensive arthroscopic debridement, arthroscopic subscapularis tendon repair, arthroscopic subacromial decompression, mini-open repair of supraspinatus tendon tear, and mini-open biceps tenodesis, right shoulder.  Anesthesia:   General endotracheal with interscalene block using Exparel placed preoperatively by the anesthesiologist.  Surgeon:   Pascal Lux, MD  Assistant:   Cameron Proud, PA-C; Rennis Chris, PA-S  Findings:   As above.  There was a near full-thickness tear involving the superior insertional fibers of the subscapularis tendon, as well as a full-thickness tear involving the entire supraspinatus tendon insertional fibers extending into the anterior insertional fibers of the infraspinatus tendon.  There was moderate degenerative labral fraying anteriorly and superiorly.  The biceps tendon demonstrated significant tendinopathy with subluxation into the subscapularis tendon.  The articular surfaces of the glenoid and humerus both were in satisfactory condition.  Complications:   None  Fluids:   900 cc  Estimated blood loss:   15 cc  Tourniquet time:   None  Drains:   None  Closure:   Staples      Brief clinical note:   The patient is a 65 year old male with a nearly 1 year history of right shoulder pain and weakness following an injury trying to start his lawnmower. The patient's symptoms have progressed despite medications, activity modification, etc. The patient's history and examination are consistent with a traumatic rotator cuff tear. These findings were confirmed by MRI scan. The patient presents at this time for definitive management of  his shoulder symptoms.  Procedure:   The patient underwent placement of an interscalene block using Exparel by the anesthesiologist in the preoperative holding area before being brought into the operating room and lain in the supine position. The patient then underwent general endotracheal intubation and anesthesia before being repositioned in the beach chair position using the beach chair positioner. The right shoulder and upper extremity were prepped with ChloraPrep solution before being draped sterilely. Preoperative antibiotics were administered. A timeout was performed to confirm the proper surgical site before the expected portal sites and incision site were injected with 0.5% Sensorcaine with epinephrine.   A posterior portal was created and the glenohumeral joint thoroughly inspected with the findings as described above. An anterior portal was created using an outside-in technique. The labrum and rotator cuff were further probed, again confirming the above-noted findings. Areas of labral fraying were debrided back to stable margins using the full-radius resector, as were areas of synovitis. The torn portions of the rotator cuff tears also were debrided back to stable margins using the full-radius resector. The ArthroCare wand was inserted and used to release the biceps tendon from its labral anchor.  It is also was used to obtain hemostasis as well as to "anneal" the labrum superiorly and anteriorly. The instruments were removed from the joint after suctioning the excess fluid.  The camera was repositioned through the posterior portal into the subacromial space. A separate lateral portal was created using an outside-in technique. The 3.5 mm full-radius resector was introduced and used to perform a subtotal bursectomy. The ArthroCare wand was then inserted and used to remove the periosteal tissue off the undersurface of the anterior third of the acromion as well as to recess the coracoacromial ligament  from  its attachment along the anterior and lateral margins of the acromion. The 4.0 mm acromionizing bur was introduced and used to complete the decompression by removing the undersurface of the anterior third of the acromion. The full radius resector was reintroduced to remove any residual bony debris before the ArthroCare wand was reintroduced to obtain hemostasis. The instruments were then removed from the subacromial space after suctioning the excess fluid.  An approximately 4-5 cm incision was made over the anterolateral aspect of the shoulder beginning at the anterolateral corner of the acromion and extending distally in line with the bicipital groove. This incision was carried down through the subcutaneous tissues to expose the deltoid fascia. The raphae between the anterior and middle thirds was identified and this plane developed to provide access into the subacromial space. Additional bursal tissues were debrided sharply using Metzenbaum scissors. The rotator cuff tear was readily identified. The margins were debrided sharply with a #15 blade and the exposed greater tuberosity roughened with a rongeur. The tear was repaired using two [employer] 2.8 mm Q-Fix anchors. These sutures were then brought back laterally and secured using two [place] knotless RegeneSorb anchors to create a two-layer closure. An apparent watertight closure was obtained.  The bicipital groove was identified by palpation and opened for 1-1.5 cm. The biceps tendon stump was retrieved through this defect. The floor of the bicipital groove was roughened with a curet before a a single Biomet 2.9 mm JuggerKnot anchor was inserted. Both sets of sutures were passed through the biceps tendon and tied securely to effect the tenodesis. The bicipital sheath was reapproximated using two #0 Ethibond interrupted sutures, incorporating the biceps tendon to further reinforce the tenodesis.  The wound was copiously irrigated  with sterile saline solution before the deltoid raphae was reapproximated using 2-0 Vicryl interrupted sutures. The subcutaneous tissues were closed in two layers using 2-0 Vicryl interrupted sutures before the skin was closed using staples. The portal sites also were closed using staples. A sterile bulky dressing was applied to the shoulder before the arm was placed into a shoulder immobilizer. The patient was then awakened, extubated, and returned to the recovery room in satisfactory condition after tolerating the procedure well.

## 2021-08-25 ENCOUNTER — Encounter: Payer: Self-pay | Admitting: Surgery

## 2021-09-01 NOTE — Anesthesia Postprocedure Evaluation (Signed)
Anesthesia Post Note  Patient: Connor Freeman  Procedure(s) Performed: SHOULDER ARTHROSCOPY WITH DEBRIDEMENT, DECOMPRESSION, AND  ROTATOR CUFF REPAIR (Right: Shoulder) BICEPS TENODESIS  Patient location during evaluation: PACU Anesthesia Type: General Level of consciousness: awake and alert Pain management: pain level controlled Vital Signs Assessment: post-procedure vital signs reviewed and stable Respiratory status: spontaneous breathing, nonlabored ventilation, respiratory function stable and patient connected to nasal cannula oxygen Cardiovascular status: blood pressure returned to baseline and stable Postop Assessment: no apparent nausea or vomiting Anesthetic complications: no   No notable events documented.   Last Vitals:  Vitals:   08/24/21 1459 08/24/21 1506  BP:  132/73  Pulse: 64 64  Resp: 16 17  Temp: (!) 36.4 C (!) 36.1 C  SpO2: 94% 93%    Last Pain:  Vitals:   08/24/21 1506  TempSrc: Temporal  PainSc: 0-No pain                 Molli Barrows

## 2021-11-10 ENCOUNTER — Other Ambulatory Visit: Payer: Self-pay | Admitting: Nurse Practitioner

## 2021-11-10 NOTE — Telephone Encounter (Signed)
This is a [place] pt 

## 2021-11-29 IMAGING — CR DG CHEST 2V
1 series · 2 of 2 positions shown · non-contrast
Comparison: None.

CLINICAL DATA: Preoperative examination. Patient for shoulder
surgery.

EXAM:
CHEST - 2 VIEW

[Series 1: dg chest 2 view · 0.14mm/px · 2 of 2 slices shown]
[im 1/2]
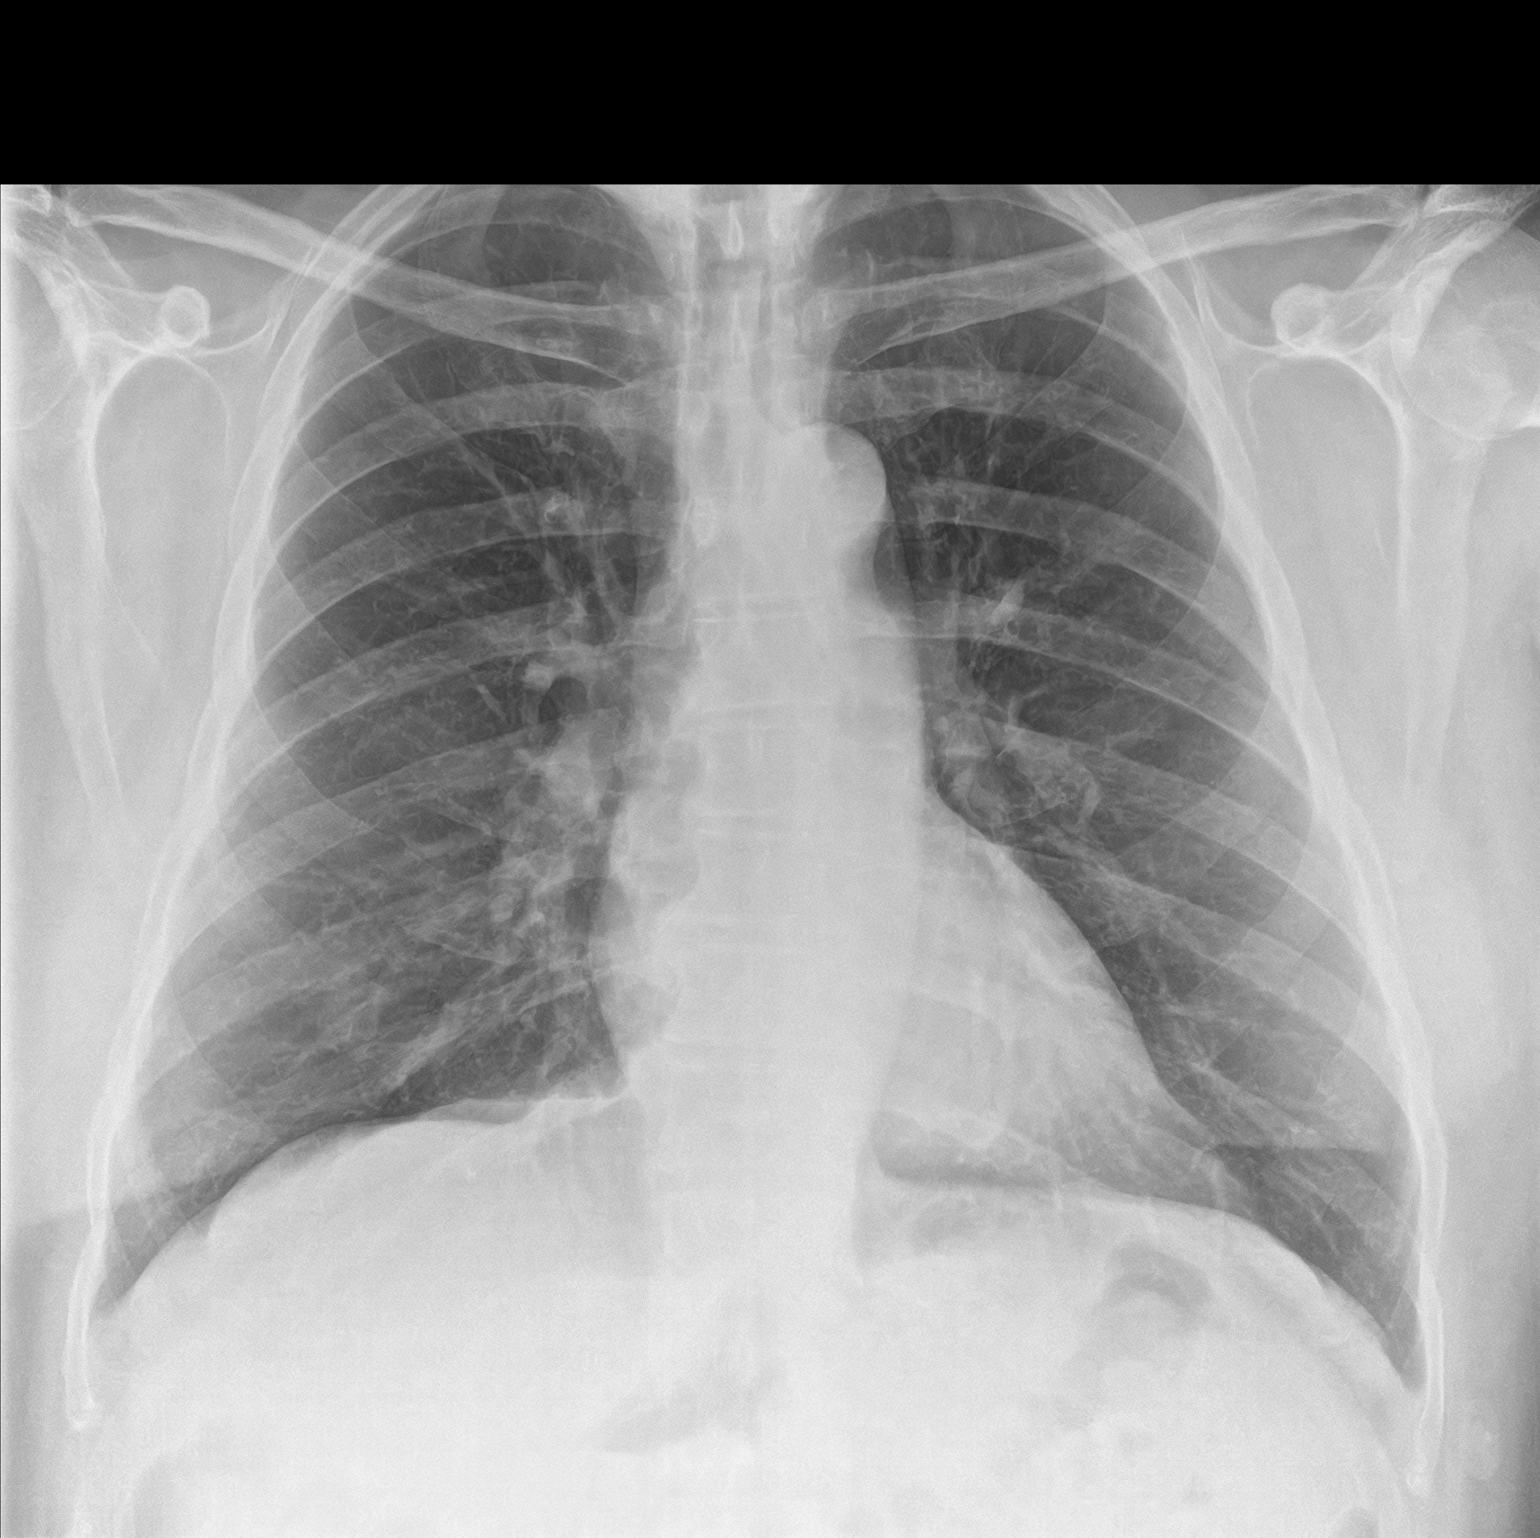
[im 2/2]
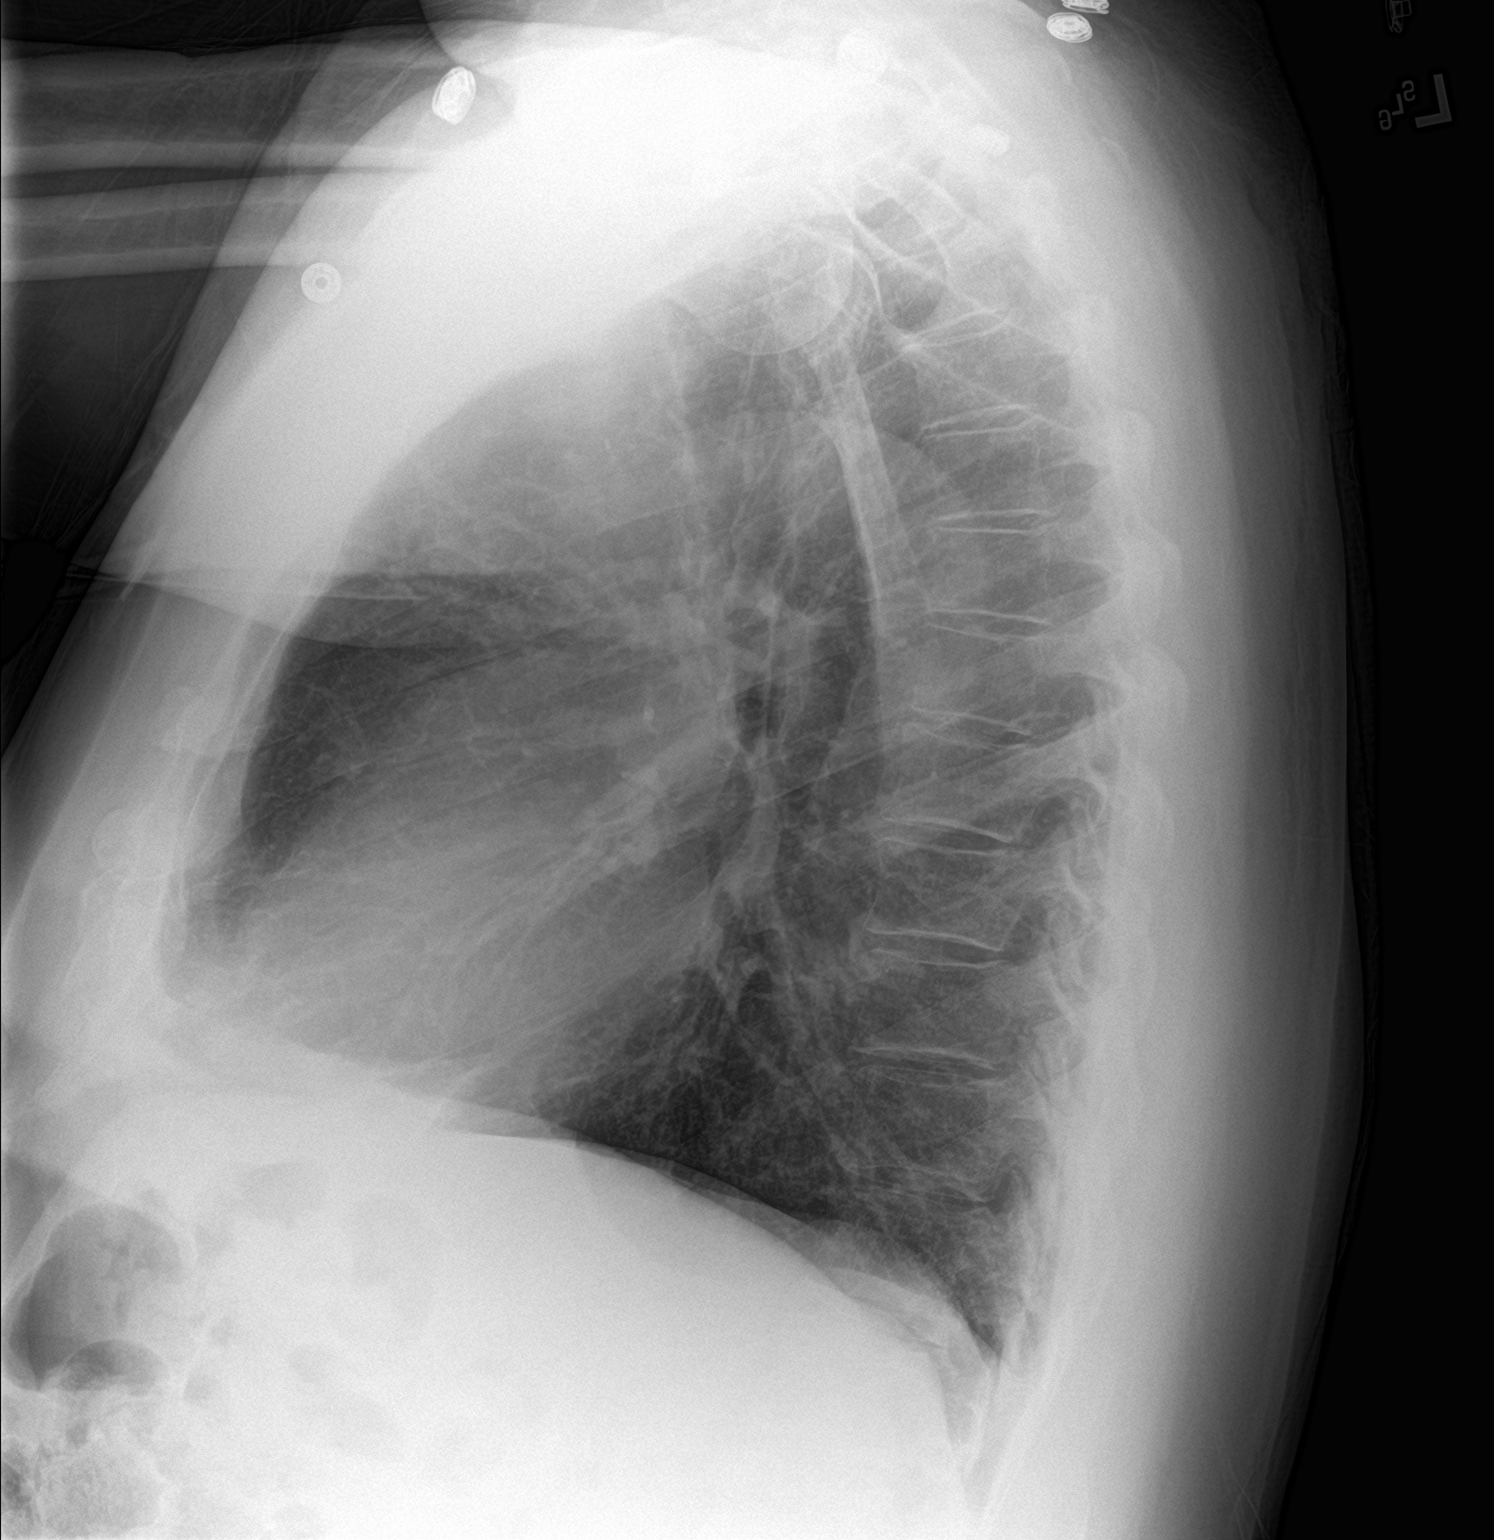

[2 of 2 positions shown; findings below may reference images not displayed]

FINDINGS: Lungs are clear. Heart size is normal. No pneumothorax or pleural
fluid. No acute or focal bony abnormality.
IMPRESSION: No acute disease.

## 2021-12-12 ENCOUNTER — Other Ambulatory Visit: Payer: Self-pay | Admitting: Nurse Practitioner

## 2021-12-14 NOTE — Telephone Encounter (Signed)
Please schedule 6 month F/U appointment. Thank you! 

## 2021-12-14 NOTE — Telephone Encounter (Signed)
LVM to schedule

## 2021-12-14 NOTE — Telephone Encounter (Signed)
This is a [place] pt 

## 2021-12-27 ENCOUNTER — Other Ambulatory Visit: Payer: Self-pay

## 2021-12-27 MED ORDER — DILTIAZEM HCL ER COATED BEADS 240 MG PO CP24: 240.0000 mg | ORAL_CAPSULE | Freq: Every day | ORAL | 0 refills | Status: DC

## 2022-01-03 ENCOUNTER — Other Ambulatory Visit: Payer: Self-pay | Admitting: Nurse Practitioner

## 2022-01-03 NOTE — Telephone Encounter (Signed)
Prescription refill request for Eliquis received. Indication: Afib  Last office visit: 06/07/21  Scr: 0.8 (08/11/21)  Age: 66 Weight: 122.5kg  Appropriate dose an refill sent to requested pharmacy.

## 2022-01-25 ENCOUNTER — Other Ambulatory Visit: Payer: Self-pay

## 2022-01-25 ENCOUNTER — Ambulatory Visit (INDEPENDENT_AMBULATORY_CARE_PROVIDER_SITE_OTHER): Payer: 59 | Admitting: Cardiology

## 2022-01-25 ENCOUNTER — Encounter: Payer: Self-pay | Admitting: Cardiology

## 2022-01-25 VITALS — BP 148/80 | HR 71 | Ht 72.0 in | Wt 289.0 lb

## 2022-01-25 DIAGNOSIS — E7800 Pure hypercholesterolemia, unspecified: Secondary | ICD-10-CM | POA: Diagnosis not present

## 2022-01-25 DIAGNOSIS — I10 Essential (primary) hypertension: Secondary | ICD-10-CM | POA: Diagnosis not present

## 2022-01-25 DIAGNOSIS — I4891 Unspecified atrial fibrillation: Secondary | ICD-10-CM | POA: Diagnosis not present

## 2022-01-25 DIAGNOSIS — G4733 Obstructive sleep apnea (adult) (pediatric): Secondary | ICD-10-CM

## 2022-01-25 NOTE — Patient Instructions (Signed)
Medication Instructions:  Your physician recommends that you continue on your current medications as directed. Please refer to the Current Medication list given to you today.  *If you need a refill on your cardiac medications before your next appointment, please call your pharmacy*   Lab Work: None ordered If you have labs (blood work) drawn today and your tests are completely normal, you will receive your results only by: [place] (if you have MyChart) OR A paper copy in the mail If you have any lab test that is abnormal or we need to change your treatment, we will call you to review the results.   Testing/Procedures: None ordered   Follow-Up: At [facility], you and your health needs are our priority.  As part of our continuing mission to provide you with exceptional heart care, we have created designated Provider Care Teams.  These Care Teams include your primary Cardiologist (physician) and Advanced Practice Providers (APPs -  Physician Assistants and Nurse Practitioners) who all work together to provide you with the care you need, when you need it.  We recommend signing up for the patient portal called "MyChart".  Sign up information is provided on this After Visit Summary.  MyChart is used to connect with patients for Virtual Visits (Telemedicine).  Patients are able to view lab/test results, encounter notes, upcoming appointments, etc.  Non-urgent messages can be sent to your provider as well.   To learn more about what you can do with MyChart, go to [email or web address].    Your next appointment:   6 month(s)  The format for your next appointment:   In Person  Provider:   You may see Kate Sable, MD or one of the following Advanced Practice Providers on your designated Care Team:   Murray Hodgkins, NP Christell Faith, PA-C Cadence Kathlen Mody, [place]    Other Instructions

## 2022-01-25 NOTE — Progress Notes (Signed)
Cardiology Office Note:    Date:  01/25/2022   ID:  Connor Freeman, DOB [birth year], MRN [id number]  PCP:  Kirk Ruths, MD   [facility] Providers Cardiologist:  Kate Sable, MD     Referring MD: Kirk Ruths, MD   Chief Complaint  Patient presents with   Other    6 month follow up -- Meds reviewed verbally with patient.     History of Present Illness:    Connor Freeman is a 66 y.o. male with a hx of paroxysmal atrial fibrillation, hypertension, DM, hyperlipidemia, obesity, OSA who presents for follow-up.  Being seen for paroxysmal atrial fibrillation.  Started on Cardizem and Eliquis, no bleeding issues.  Previously advised to follow-up with sleep specialist regarding OSA management.  He did follow-up with sleep specialist, CPAP mask was prescribed.  Patient endorsed not using CPAP mask over the past month due to difficulty wearing mask, but states he is going to start being more compliant with mask.  Otherwise denies palpitations, chest pain or shortness of breath.  Last blood pressure checks at PCP were normal with systolics in the 355D.  Prior notes Echo 04/2021 showed normal systolic function, EF 60 to 65%. [place] 05/24/2021, low risk study, no evidence for ischemia.  Past Medical History:  Diagnosis Date   Arthritis    hands and wrist   Atrial fibrillation ([place])    a. 04/2012 incidentally noted preop shoulder surgery.  CHA2DS2VASc = 2; b. 04/2021 Echo: EF 60-65%.  No rwma. Nl RV size/fxn.  No significant valvular disease.   Chronic anticoagulation    Apixaban   GERD (gastroesophageal reflux disease)    History of 2019 novel coronavirus disease (COVID-19) 08/11/2021   home test (+)   Hyperlipidemia    Hypertension    Obesity    Right bundle branch block    Skin cancer 1990   throat and neck area   Sleep difficulties    takes melatonin   Snores    Type II diabetes mellitus ([place])     Past Surgical History:  Procedure Laterality Date    BICEPT TENODESIS  08/24/2021   Procedure: BICEPS TENODESIS;  Surgeon: Corky Mull, MD;  Location: [facility] ORS;  Service: Orthopedics;;   excision on skin cancer   1990   SHOULDER ARTHROSCOPY WITH OPEN ROTATOR CUFF REPAIR Right 08/24/2021   Procedure: SHOULDER ARTHROSCOPY WITH DEBRIDEMENT, DECOMPRESSION, AND  ROTATOR CUFF REPAIR;  Surgeon: Corky Mull, MD;  Location: [facility] ORS;  Service: Orthopedics;  Laterality: Right;    Current Medications: Current Meds  Medication Sig   acetaminophen (TYLENOL) 325 MG tablet Take 650 mg by mouth every 6 (six) hours as needed for moderate pain.   albuterol (VENTOLIN HFA) 108 (90 Base) MCG/ACT inhaler Inhale into the lungs every 6 (six) hours as needed for wheezing or shortness of breath.   Cyanocobalamin (VITAMIN B-12) 5000 MCG SUBL Take 5,000 mcg by mouth in the morning.   diltiazem (CARDIZEM CD) 240 MG 24 hr capsule Take 1 capsule (240 mg total) by mouth daily. PLEASE KEEP SCHEDULED OFFICE VISIT FOR FURTHER REFILLS. THANK YOU!   ELIQUIS 5 MG TABS tablet TAKE 1 TABLET TWICE A DAY   Melatonin 10 MG CAPS Take 10 mg by mouth at bedtime as needed (sleep).   Multiple Vitamin (MULTIVITAMIN WITH MINERALS) TABS tablet Take 1 tablet by mouth in the morning. Centrum Silver   omeprazole (PRILOSEC) 40 MG capsule Take 40 mg by mouth in the morning.  OVER THE COUNTER MEDICATION Take 1 capsule by mouth daily. Alpha brain   oxyCODONE (ROXICODONE) 5 MG immediate release tablet Take 1-2 tablets (5-10 mg total) by mouth every 4 (four) hours as needed for moderate pain or severe pain.   rosuvastatin (CRESTOR) 40 MG tablet Take 40 mg by mouth in the morning.     Allergies:   Patient has no known allergies.   Social History   Socioeconomic History   Marital status: Married    Spouse name: Not on file   Number of children: Not on file   Years of education: Not on file   Highest education level: Not on file  Occupational History   Not on file  Tobacco Use   Smoking  status: Never   Smokeless tobacco: Never  Substance and Sexual Activity   Alcohol use: Yes    Comment: 1 beer/wk   Drug use: Never   Sexual activity: Not on file  Other Topics Concern   Not on file  Social History Narrative   Live with wife. Still working.    Social Determinants of Health   Financial Resource Strain: Not on file  Food Insecurity: Not on file  Transportation Needs: Not on file  Physical Activity: Not on file  Stress: Not on file  Social Connections: Not on file     Family History: The patient's family history includes Heart attack in his father; Heart defect in his brother; Stroke in his brother and mother.  ROS:   Please see the history of present illness.     All other systems reviewed and are negative.  EKGs/Labs/Other Studies Reviewed:    The following studies were reviewed today:   EKG:  EKG is  ordered today.  The ekg ordered today demonstrates normal sinus rhythm.  Recent Labs: 04/27/2021: B Natriuretic Peptide 395.7; Magnesium 2.1; TSH 1.948 04/28/2021: BUN 19; Creatinine, Ser 0.84; Hemoglobin 15.8; Platelets 208; Potassium 3.9; Sodium 137  Recent Lipid Panel No results found for: CHOL, TRIG, HDL, CHOLHDL, VLDL, LDLCALC, LDLDIRECT   Risk Assessment/Calculations:          Physical Exam:    VS:  BP (!) 148/80 (BP Location: Left Arm, Patient Position: Sitting, Cuff Size: Large)    Pulse 71    Ht 6' (1.829 m)    Wt 289 lb (131.1 kg)    SpO2 96%    BMI 39.20 kg/m     Wt Readings from Last 3 Encounters:  01/25/22 289 lb (131.1 kg)  08/24/21 270 lb (122.5 kg)  07/13/21 270 lb 3.2 oz (122.6 kg)     GEN:  Well nourished, well developed in no acute distress HEENT: Normal NECK: No JVD; No carotid bruits LYMPHATICS: No lymphadenopathy CARDIAC: RRR, no murmurs, rubs, gallops RESPIRATORY:  Clear to auscultation without rales, wheezing or rhonchi  ABDOMEN: Soft, non-tender, non-distended MUSCULOSKELETAL:  No edema; No deformity  SKIN: Warm and  dry NEUROLOGIC:  Alert and oriented x 3 PSYCHIATRIC:  Normal affect   ASSESSMENT:    1. Atrial fibrillation, unspecified type ([place])   2. Primary hypertension   3. Pure hypercholesterolemia   4. OSA (obstructive sleep apnea)     PLAN:    In order of problems listed above:  Paroxysmal atrial fibrillation, CHA2DS2-VASc score 2(htn, dm).  Currently in sinus rhythm.  Echo with preserved ejection fraction, EF 60 to 65%.  Lexiscan Myoview no evidence for ischemia.  Continue Cardizem, Eliquis.  Treatment of OSA and compliance with CPAP mask encouraged.  Hypertension,  BP elevated, usually controlled.  Continue Cardizem 240 mg daily.  If BP stays elevated, consider titrating Cardizem to 300 mg daily. Hyperlipidemia, continue statin. OSA, compliant with CPAP mask advised.  Follow-up in 6 months.    Medication Adjustments/Labs and Tests Ordered: Current medicines are reviewed at length with the patient today.  Concerns regarding medicines are outlined above.  Orders Placed This Encounter  Procedures   EKG 12-Lead   No orders of the defined types were placed in this encounter.    Patient Instructions  Medication Instructions:  Your physician recommends that you continue on your current medications as directed. Please refer to the Current Medication list given to you today.  *If you need a refill on your cardiac medications before your next appointment, please call your pharmacy*   Lab Work: None ordered If you have labs (blood work) drawn today and your tests are completely normal, you will receive your results only by: [place] (if you have MyChart) OR A paper copy in the mail If you have any lab test that is abnormal or we need to change your treatment, we will call you to review the results.   Testing/Procedures: None ordered   Follow-Up: At [facility], you and your health needs are our priority.  As part of our continuing mission to provide you with  exceptional heart care, we have created designated Provider Care Teams.  These Care Teams include your primary Cardiologist (physician) and Advanced Practice Providers (APPs -  Physician Assistants and Nurse Practitioners) who all work together to provide you with the care you need, when you need it.  We recommend signing up for the patient portal called "MyChart".  Sign up information is provided on this After Visit Summary.  MyChart is used to connect with patients for Virtual Visits (Telemedicine).  Patients are able to view lab/test results, encounter notes, upcoming appointments, etc.  Non-urgent messages can be sent to your provider as well.   To learn more about what you can do with MyChart, go to [email or web address].    Your next appointment:   6 month(s)  The format for your next appointment:   In Person  Provider:   You may see Kate Sable, MD or one of the following Advanced Practice Providers on your designated Care Team:   Murray Hodgkins, NP Christell Faith, PA-C Cadence Kathlen Mody, [place]    Other Instructions     Signed, Kate Sable, MD  01/25/2022 10:34 AM    [place]

## 2022-05-26 ENCOUNTER — Other Ambulatory Visit: Payer: Self-pay

## 2022-05-26 MED ORDER — DILTIAZEM HCL ER COATED BEADS 240 MG PO CP24: 240.0000 mg | ORAL_CAPSULE | Freq: Every day | ORAL | 0 refills | Status: DC

## 2023-03-22 DIAGNOSIS — Z6839 Body mass index (BMI) 39.0-39.9, adult: Secondary | ICD-10-CM | POA: Diagnosis not present

## 2023-03-22 DIAGNOSIS — E7800 Pure hypercholesterolemia, unspecified: Secondary | ICD-10-CM | POA: Diagnosis not present

## 2023-03-22 DIAGNOSIS — Z0000 Encounter for general adult medical examination without abnormal findings: Secondary | ICD-10-CM | POA: Diagnosis not present

## 2023-03-22 DIAGNOSIS — M25551 Pain in right hip: Secondary | ICD-10-CM | POA: Diagnosis not present

## 2023-03-22 DIAGNOSIS — E118 Type 2 diabetes mellitus with unspecified complications: Secondary | ICD-10-CM | POA: Diagnosis not present

## 2023-03-22 DIAGNOSIS — I10 Essential (primary) hypertension: Secondary | ICD-10-CM | POA: Diagnosis not present

## 2023-03-22 DIAGNOSIS — G4733 Obstructive sleep apnea (adult) (pediatric): Secondary | ICD-10-CM | POA: Diagnosis not present

## 2023-03-22 DIAGNOSIS — I480 Paroxysmal atrial fibrillation: Secondary | ICD-10-CM | POA: Diagnosis not present

## 2023-06-19 DIAGNOSIS — M79604 Pain in right leg: Secondary | ICD-10-CM | POA: Diagnosis not present

## 2023-06-30 DIAGNOSIS — G5711 Meralgia paresthetica, right lower limb: Secondary | ICD-10-CM | POA: Diagnosis not present

## 2023-06-30 DIAGNOSIS — M1611 Unilateral primary osteoarthritis, right hip: Secondary | ICD-10-CM | POA: Diagnosis not present

## 2023-09-21 DIAGNOSIS — Z125 Encounter for screening for malignant neoplasm of prostate: Secondary | ICD-10-CM | POA: Diagnosis not present

## 2023-09-21 DIAGNOSIS — I10 Essential (primary) hypertension: Secondary | ICD-10-CM | POA: Diagnosis not present

## 2023-09-21 DIAGNOSIS — E118 Type 2 diabetes mellitus with unspecified complications: Secondary | ICD-10-CM | POA: Diagnosis not present

## 2023-09-28 DIAGNOSIS — Z6836 Body mass index (BMI) 36.0-36.9, adult: Secondary | ICD-10-CM | POA: Diagnosis not present

## 2023-09-28 DIAGNOSIS — E7800 Pure hypercholesterolemia, unspecified: Secondary | ICD-10-CM | POA: Diagnosis not present

## 2023-09-28 DIAGNOSIS — I480 Paroxysmal atrial fibrillation: Secondary | ICD-10-CM | POA: Diagnosis not present

## 2023-09-28 DIAGNOSIS — I10 Essential (primary) hypertension: Secondary | ICD-10-CM | POA: Diagnosis not present

## 2023-09-28 DIAGNOSIS — G4733 Obstructive sleep apnea (adult) (pediatric): Secondary | ICD-10-CM | POA: Diagnosis not present

## 2023-09-28 DIAGNOSIS — E118 Type 2 diabetes mellitus with unspecified complications: Secondary | ICD-10-CM | POA: Diagnosis not present

## 2023-10-18 ENCOUNTER — Encounter: Payer: Self-pay | Admitting: Urology

## 2023-10-18 ENCOUNTER — Ambulatory Visit: Payer: Medicare HMO | Admitting: Urology

## 2023-10-18 ENCOUNTER — Other Ambulatory Visit
Admission: RE | Admit: 2023-10-18 | Discharge: 2023-10-18 | Disposition: A | Discharge status: Home / Self Care | Payer: Medicare HMO | Attending: Urology | Admitting: Urology

## 2023-10-18 VITALS — BP 147/83 | HR 67 | Ht 72.0 in | Wt 266.4 lb

## 2023-10-18 DIAGNOSIS — R972 Elevated prostate specific antigen [PSA]: Secondary | ICD-10-CM

## 2023-10-18 NOTE — Progress Notes (Signed)
10/18/23 2:20 PM   Connor Freeman [birth year] [id number]  CC: Elevated PSA  HPI: 67 year old male with medical history notable for obesity(BMI 36), A-fib on Eliquis, type 2 diabetes who presents with an elevated PSA of 10.4 from 09/21/2023.  This had increased from prior value of 3 in April 2020.  He denies any family history of prostate cancer.  He denies any urinary symptoms, dysuria, or gross hematuria.  He has nocturia 1-2 times per night, this is typically associated with drinking soda prior to bedtime.  He denies any problems with erections, no prior abdominal surgeries.  PMH: Past Medical History:  Diagnosis Date   Arthritis    hands and wrist   Atrial fibrillation ([facility])    a. 04/2012 incidentally noted preop shoulder surgery.  CHA2DS2VASc = 2; b. 04/2021 Echo: EF 60-65%.  No rwma. Nl RV size/fxn.  No significant valvular disease.   Chronic anticoagulation    Apixaban   GERD (gastroesophageal reflux disease)    History of 2019 novel coronavirus disease (COVID-19) 08/11/2021   home test (+)   Hyperlipidemia    Hypertension    Obesity    Right bundle branch block    Skin cancer 1990   throat and neck area   Sleep difficulties    takes melatonin   Snores    Type II diabetes mellitus ([facility])     Surgical History: Past Surgical History:  Procedure Laterality Date   BICEPT TENODESIS  08/24/2021   Procedure: BICEPS TENODESIS;  Surgeon: Christena Flake, MD;  Location: [facility] ORS;  Service: Orthopedics;;   excision on skin cancer   1990   SHOULDER ARTHROSCOPY WITH OPEN ROTATOR CUFF REPAIR Right 08/24/2021   Procedure: SHOULDER ARTHROSCOPY WITH DEBRIDEMENT, DECOMPRESSION, AND  ROTATOR CUFF REPAIR;  Surgeon: Christena Flake, MD;  Location: [facility] ORS;  Service: Orthopedics;  Laterality: Right;    Family History: Family History  Problem Relation Age of Onset   Stroke Mother        died in her 10's   Heart attack Father        died in his 8's   Stroke Brother    Heart defect Brother         s/p valve replacement in his 72s    Social History:  reports that he has never smoked. He has never used smokeless tobacco. He reports current alcohol use. He reports that he does not use drugs.  Physical Exam: BP (!) 147/83 (BP Location: Left Arm, Patient Position: Sitting, Cuff Size: Large)   Pulse 67   Ht 6' (1.829 m)   Wt 266 lb 6.4 oz (120.8 kg)   BMI 36.13 kg/m    Constitutional:  Alert and oriented, No acute distress. Cardiovascular: No clubbing, cyanosis, or edema. Respiratory: Normal respiratory effort, no increased work of breathing. GI: Abdomen is soft, nontender, nondistended, no abdominal masses   Laboratory Data: PSA history reviewed, see HPI  Pertinent Imaging: No prior cross-sectional imaging to review  Assessment & Plan:   67 year old male with elevated PSA of 10.4 from baseline value of 3 in 2020.  We reviewed the implications of an elevated PSA and the uncertainty surrounding it. In general, a man's PSA increases with age and is produced by both normal and cancerous prostate tissue. The differential diagnosis for elevated PSA includes BPH, prostate cancer, infection, recent intercourse/ejaculation, recent urethroscopic manipulation (foley placement/cystoscopy) or trauma, and prostatitis. Management of an elevated PSA can include observation or prostate biopsy and we discussed  this in detail. Our goal is to detect clinically significant prostate cancers, and manage with either active surveillance, surgery, or radiation for localized disease. Risks of prostate biopsy include bleeding, infection (including life threatening sepsis), pain, and lower urinary symptoms. Hematuria, hematospermia, and blood in the stool are all common after biopsy and can persist up to 4 weeks.   PSA reflex to free today, if remains significantly elevated will schedule prostate biopsy.  If downtrending will follow to normal  Legrand Rams, MD 10/18/2023  [facility] [address], [address] [place], [place] [address] [phone]

## 2023-10-18 NOTE — Patient Instructions (Addendum)
Prostate Cancer Screening  Prostate cancer screening is testing that is done to check for the presence of prostate cancer in men. The prostate gland is a walnut-sized gland that is located below the bladder and in front of the rectum in males. The function of the prostate is to add fluid to semen during ejaculation. Prostate cancer is one of the most common types of cancer in men. Who should have prostate cancer screening? Screening recommendations vary based on age and other risk factors, as well as between the professional organizations who make the recommendations. In general, screening is recommended if: You are age 23 to 65 and have an average risk for prostate cancer. You should talk with your health care provider about your need for screening and how often screening should be done. Because most prostate cancers are slow growing and will not cause death, screening in this age group is generally reserved for men who have a 10- to 15-year life expectancy. You are younger than age 65, and you have these risk factors: Having a father, brother, or uncle who has been diagnosed with prostate cancer. The risk is higher if your family member's cancer occurred at an early age or if you have multiple family members with prostate cancer at an early age. Being a male who is [place] or is of [place] or sub-Saharan African descent. In general, screening is not recommended if: You are younger than age 80. You are between the ages of 65 and 31 and you have no risk factors. You are 65 years of age or older. At this age, the risks that screening can cause are greater than the benefits that it may provide. If you are at high risk for prostate cancer, your health care provider may recommend that you have screenings more often or that you start screening at a younger age. How is screening for prostate cancer done? The recommended prostate cancer screening test is a blood test called the prostate-specific antigen  (PSA) test. PSA is a protein that is made in the prostate. As you age, your prostate naturally produces more PSA. Abnormally high PSA levels may be caused by: Prostate cancer. An enlarged prostate that is not caused by cancer (benign prostatic hyperplasia, or BPH). This condition is very common in older men. A prostate gland infection (prostatitis) or urinary tract infection. Certain medicines such as male hormones (like testosterone) or other medicines that raise testosterone levels. A rectal exam may be done as part of prostate cancer screening to help provide information about the size of your prostate gland. When a rectal exam is performed, it should be done after the PSA level is drawn to avoid any effect on the results. Depending on the PSA results, you may need more tests, such as: A physical exam to check the size of your prostate gland, if not done as part of screening. Blood and imaging tests. A procedure to remove tissue samples from your prostate gland for testing (biopsy). This is the only way to know for certain if you have prostate cancer. What are the benefits of prostate cancer screening? Screening can help to identify cancer at an early stage, before symptoms start and when the cancer can be treated more easily. There is a small chance that screening may lower your risk of dying from prostate cancer. The chance is small because prostate cancer is a slow-growing cancer, and most men with prostate cancer die from a different cause. What are the risks of prostate cancer screening? The  main risk of prostate cancer screening is diagnosing and treating prostate cancer that would never have caused any symptoms or problems. This is called overdiagnosisand overtreatment. PSA screening cannot tell you if your PSA is high due to cancer or a different cause. A prostate biopsy is the only procedure to diagnose prostate cancer. Even the results of a biopsy may not tell you if your cancer needs to  be treated. Slow-growing prostate cancer may not need any treatment other than monitoring, so diagnosing and treating it may cause unnecessary stress or other side effects. Questions to ask your health care provider When should I start prostate cancer screening? What is my risk for prostate cancer? How often do I need screening? What type of screening tests do I need? How do I get my test results? What do my results mean? Do I need treatment? Where to find more information The American Cancer Society: www.cancer.org American Urological Association: www.auanet.org Contact a health care provider if: You have difficulty urinating. You have pain when you urinate or ejaculate. You have blood in your urine or semen. You have pain in your back or in the area of your prostate. Summary Prostate cancer is a common type of cancer in men. The prostate gland is located below the bladder and in front of the rectum. This gland adds fluid to semen during ejaculation. Prostate cancer screening may identify cancer at an early stage, when the cancer can be treated more easily and is less likely to have spread to other areas of the body. The prostate-specific antigen (PSA) test is the recommended screening test for prostate cancer, but it has associated risks. Discuss the risks and benefits of prostate cancer screening with your health care provider. If you are age 65 or older, the risks that screening can cause are greater than the benefits that it may provide. This information is not intended to replace advice given to you by your health care provider. Make sure you discuss any questions you have with your health care provider. Document Revised: 05/31/2021 Document Reviewed: 05/31/2021 Elsevier Patient Education  2024 Elsevier Inc.  Prostate Biopsy Instructions  Stop all aspirin or blood thinners (aspirin, plavix, coumadin, warfarin, motrin, ibuprofen, advil, aleve, naproxen, naprosyn) for 7 days prior to  the procedure.  If you have any questions about stopping these medications, please contact your primary care physician or cardiologist.  Having a light meal prior to the procedure is recommended.  If you are diabetic or have low blood sugar please bring a small snack or glucose tablet.  A Fleets enema is needed to be purchased over the counter at a local pharmacy and used 2 hours before you scheduled appointment.  This can be purchased over the counter at any pharmacy.  Antibiotics will be administered in the clinic at the time of the procedure unless otherwise specified.    Please bring someone with you to the procedure to drive you home.  A follow up appointment has been scheduled for you to receive the results of the biopsy.  If you have any questions or concerns, please feel free to call the office at [phone] or send a Mychart message.    Thank you, Staff at [facility]   Transrectal Ultrasound-Guided Prostate Biopsy A transrectal ultrasound-guided prostate biopsy is a procedure to remove samples of prostate tissue for testing. The prostate is a walnut-sized gland that is located below the bladder and in front of the rectum. During this procedure, a small device (probe) is  lubricated and put inside the rectum. The probe sends out sound waves that make a picture of the prostate and surrounding tissues (transrectal ultrasound). The images are used to help guide the process of removing the samples. The samples are taken to a lab to be checked for prostate cancer. This procedure is usually done to evaluate the prostate gland of men who have raised (elevated) levels of prostate-specific antigen (PSA), which can be a sign of prostate cancer or prostate enlargement related to aging (benign prostatic hyperplasia, or BPH). Tell a health care provider about: Any allergies you have. All medicines you are taking, including vitamins, herbs, eye drops, creams, and over-the-counter  medicines. Any problems you or family members have had with anesthetic medicines. Any bleeding problems you have. Any surgeries you have had. Any medical conditions you have. Any prostate infections you have had. What are the risks? Generally, this is a safe procedure. However, problems may occur, including: Prostate infection. Bleeding from the rectum. Blood in the urine. Allergic reactions to medicines. Damage to surrounding structures such as blood vessels, organs, or muscles. Difficulty passing urine. Nerve damage. This is usually temporary. What happens before the procedure? Medicines Ask your health care provider about: Changing or stopping your regular medicines. This is especially important if you are taking diabetes medicines or blood thinners. Taking medicines such as aspirin and ibuprofen. These medicines can thin your blood. Do not take these medicines unless your health care provider tells you to take them. Taking over-the-counter medicines, vitamins, herbs, and supplements. General instructions Follow instructions from your health care provider about eating and drinking. In most instances, you will not need to stop eating and drinking completely before the procedure. You will be given an enema. During an enema, a liquid is injected into your rectum to clear out waste. You may have a blood or urine sample taken. Ask your health care provider what steps will be taken to help prevent infection. These steps may include: Washing skin with a germ-killing soap. Taking antibiotic medicine. If you will be going home right after the procedure, plan to have a responsible adult: Take you home from the hospital or clinic. You will not be allowed to drive. Care for you for the time you are told. What happens during the procedure?  An IV will be inserted into one of your veins. You will be given one or both of the following: A medicine to help you relax (sedative). A medicine to  numb the area (local anesthetic). You will be placed on your left side, and your knees will be bent toward your chest. A probe with lubricated gel will be placed into your rectum, and images will be taken of your prostate and surrounding structures. Numbing medicine will be injected into your prostate. A biopsy needle will be inserted through your rectum or perineum and guided to your prostate using the ultrasound images. Prostate tissue samples will be removed, and the needle and probe will then be removed. The biopsy samples will be sent to a lab to be tested. The procedure may vary among health care providers and hospitals. What happens after the procedure? Your blood pressure, heart rate, breathing rate, and blood oxygen level will be monitored until you leave the hospital or clinic. You may have some discomfort in the rectal area. You will be given pain medicine as needed. If you were given a sedative during the procedure, it can affect you for several hours. Do not drive or operate machinery until your  health care provider says that it is safe. It is up to you to get the results of your procedure. Ask your health care provider, or the department that is doing the procedure, when your results will be ready. Keep all follow-up visits. This is important. Summary A transrectal ultrasound-guided biopsy removes samples of tissue from your prostate using ultrasound-guided sound waves to help guide the process. This procedure is usually done to evaluate the prostate gland of men who have raised (elevated) levels of prostate-specific antigen (PSA), which can be a sign of prostate cancer or prostate enlargement related to aging. After your procedure, you may feel some discomfort in the rectal area. Plan to have a responsible adult take you home from the hospital or clinic, and follow up with your health care provider for your results. This information is not intended to replace advice given to you by  your health care provider. Make sure you discuss any questions you have with your health care provider. Document Revised: 05/31/2021 Document Reviewed: 05/31/2021 Elsevier Patient Education  2024 [employer].

## 2023-10-18 NOTE — Addendum Note (Signed)
Addended by: Frankey Shown on: 10/18/2023 02:32 PM   Modules accepted: Orders

## 2023-10-20 LAB — PSA, TOTAL AND FREE
PSA, Free Pct: 9.3 %
PSA, Free: 0.53 ng/mL
Prostate Specific Ag, Serum: 5.7 ng/mL — ABNORMAL HIGH (ref 0.0–4.0)

## 2023-10-23 ENCOUNTER — Other Ambulatory Visit: Payer: Self-pay

## 2023-10-23 DIAGNOSIS — R972 Elevated prostate specific antigen [PSA]: Secondary | ICD-10-CM

## 2023-10-23 NOTE — Telephone Encounter (Signed)
PSA ordered

## 2023-10-24 DIAGNOSIS — Z860101 Personal history of adenomatous and serrated colon polyps: Secondary | ICD-10-CM | POA: Diagnosis not present

## 2023-10-24 DIAGNOSIS — K219 Gastro-esophageal reflux disease without esophagitis: Secondary | ICD-10-CM | POA: Diagnosis not present

## 2023-10-24 DIAGNOSIS — Z800 Family history of malignant neoplasm of digestive organs: Secondary | ICD-10-CM | POA: Diagnosis not present

## 2023-10-24 DIAGNOSIS — K2100 Gastro-esophageal reflux disease with esophagitis, without bleeding: Secondary | ICD-10-CM | POA: Diagnosis not present

## 2024-01-04 ENCOUNTER — Encounter: Payer: Self-pay | Admitting: Gastroenterology

## 2024-01-10 ENCOUNTER — Encounter: Payer: Self-pay | Admitting: Gastroenterology

## 2024-01-14 NOTE — H&P (Incomplete)
Pre-Procedure H&P   Patient ID: Connor Freeman is a 68 y.o. male.  Gastroenterology Provider: Jaynie Collins, DO  Referring Provider: Fransico Setters, NP PCP: Lauro Regulus, MD  Date: 01/14/2024  HPI [name] is a 68 y.o. male who presents today for Esophagogastroduodenoscopy and Colonoscopy for GERD, personal history of colon polyps .  Patient notes reflux symptoms despite PPI use.  Patient is on chronic Celebrex.  He is also on Eliquis which has been held for this procedure***  Last underwent EGD and colonoscopy in December 2016 with 1 adenomatous polyp, internal hemorrhoids, diverticulosis, grade C esophagitis along with gastritis and duodenitis  Patient is on Ozempic which is also been held for this procedure***  Creatinine 0.6 Brother-colon polyps; sister-rectal cancer (60s) deceased at age 47   Past Medical History:  Diagnosis Date   Arthritis    hands and wrist   Atrial fibrillation (HCC)    a. 04/2012 incidentally noted preop shoulder surgery.  CHA2DS2VASc = 2; b. 04/2021 Echo: EF 60-65%.  No rwma. Nl RV size/fxn.  No significant valvular disease.   Chronic anticoagulation    Apixaban   GERD (gastroesophageal reflux disease)    History of 2019 novel coronavirus disease (COVID-19) 08/11/2021   home test (+)   Hyperlipidemia    Hypertension    Obesity    Right bundle branch block    Skin cancer 1990   throat and neck area   Sleep apnea    Sleep difficulties    takes melatonin   Snores    Traumatic complete tear of right rotator cuff    Type II diabetes mellitus (HCC)     Past Surgical History:  Procedure Laterality Date   BICEPT TENODESIS  08/24/2021   Procedure: BICEPS TENODESIS;  Surgeon: Christena Flake, MD;  Location: [facility] ORS;  Service: Orthopedics;;   excision on skin cancer   1990   FRACTURE SURGERY     SHOULDER ARTHROSCOPY WITH OPEN ROTATOR CUFF REPAIR Right 08/24/2021   Procedure: SHOULDER ARTHROSCOPY WITH DEBRIDEMENT, DECOMPRESSION,  AND  ROTATOR CUFF REPAIR;  Surgeon: Christena Flake, MD;  Location: [facility] ORS;  Service: Orthopedics;  Laterality: Right;    Family History Brother-colon polyps; sister-rectal cancer (60s) deceased at age 88 No other h/o GI disease or malignancy  Review of Systems  Constitutional:  Negative for activity change, appetite change, chills, diaphoresis, fatigue, fever and unexpected weight change.  HENT:  Negative for trouble swallowing and voice change.   Respiratory:  Negative for shortness of breath and wheezing.   Cardiovascular:  Negative for chest pain, palpitations and leg swelling.  Gastrointestinal:  Negative for abdominal distention, abdominal pain, anal bleeding, blood in stool, constipation, diarrhea, nausea and vomiting.  Musculoskeletal:  Negative for arthralgias and myalgias.  Skin:  Negative for color change and pallor.  Neurological:  Negative for dizziness, syncope and weakness.  Psychiatric/Behavioral:  Negative for confusion. The patient is not nervous/anxious.   All other systems reviewed and are negative.    Medications No current facility-administered medications on file prior to encounter.   Current Outpatient Medications on File Prior to Encounter  Medication Sig Dispense Refill   acetaminophen (TYLENOL) 325 MG tablet Take 650 mg by mouth every 6 (six) hours as needed for moderate pain.     Cholecalciferol 10 MCG (400 UNIT) CHEW Chew by mouth.     Cyanocobalamin (VITAMIN B-12) 5000 MCG SUBL Take 5,000 mcg by mouth in the morning.  diltiazem (TIAZAC) 240 MG 24 hr capsule Take 240 mg by mouth daily.     ELIQUIS 5 MG TABS tablet TAKE 1 TABLET TWICE A DAY 180 tablet 3   fluticasone (FLONASE) 50 MCG/ACT nasal spray Place into the nose.     Melatonin 10 MG CAPS Take 10 mg by mouth at bedtime as needed (sleep).     Multiple Vitamin (MULTIVITAMIN WITH MINERALS) TABS tablet Take 1 tablet by mouth in the morning. Centrum Silver     omeprazole (PRILOSEC) 40 MG capsule Take  40 mg by mouth in the morning.     OVER THE COUNTER MEDICATION Take 1 capsule by mouth daily. Alpha brain     OZEMPIC, 0.25 OR 0.5 MG/DOSE, 2 MG/3ML SOPN Inject into the skin.     pregabalin (LYRICA) 150 MG capsule Take 150 mg by mouth 2 (two) times daily.     rosuvastatin (CRESTOR) 40 MG tablet Take 40 mg by mouth in the morning.      Pertinent medications related to GI and procedure were reviewed by me with the patient prior to the procedure  No current facility-administered medications for this encounter.  Current Outpatient Medications:    acetaminophen (TYLENOL) 325 MG tablet, Take 650 mg by mouth every 6 (six) hours as needed for moderate pain., Disp: , Rfl:    Cholecalciferol 10 MCG (400 UNIT) CHEW, Chew by mouth., Disp: , Rfl:    Cyanocobalamin (VITAMIN B-12) 5000 MCG SUBL, Take 5,000 mcg by mouth in the morning., Disp: , Rfl:    diltiazem (TIAZAC) 240 MG 24 hr capsule, Take 240 mg by mouth daily., Disp: , Rfl:    ELIQUIS 5 MG TABS tablet, TAKE 1 TABLET TWICE A DAY, Disp: 180 tablet, Rfl: 3   fluticasone (FLONASE) 50 MCG/ACT nasal spray, Place into the nose., Disp: , Rfl:    Melatonin 10 MG CAPS, Take 10 mg by mouth at bedtime as needed (sleep)., Disp: , Rfl:    Multiple Vitamin (MULTIVITAMIN WITH MINERALS) TABS tablet, Take 1 tablet by mouth in the morning. Centrum Silver, Disp: , Rfl:    omeprazole (PRILOSEC) 40 MG capsule, Take 40 mg by mouth in the morning., Disp: , Rfl:    OVER THE COUNTER MEDICATION, Take 1 capsule by mouth daily. Alpha brain, Disp: , Rfl:    OZEMPIC, 0.25 OR 0.5 MG/DOSE, 2 MG/3ML SOPN, Inject into the skin., Disp: , Rfl:    pregabalin (LYRICA) 150 MG capsule, Take 150 mg by mouth 2 (two) times daily., Disp: , Rfl:    rosuvastatin (CRESTOR) 40 MG tablet, Take 40 mg by mouth in the morning., Disp: , Rfl:       No Known Allergies Allergies were reviewed by me prior to the procedure  Objective   There is no height or weight on file to calculate BMI. There  were no vitals filed for this visit.  *** Physical Exam Vitals and nursing note reviewed.  Constitutional:      General: He is not in acute distress.    Appearance: Normal appearance. He is obese. He is not ill-appearing, toxic-appearing or diaphoretic.  HENT:     Head: Normocephalic and atraumatic.     Nose: Nose normal.     Mouth/Throat:     Mouth: Mucous membranes are moist.     Pharynx: Oropharynx is clear.  Eyes:     General: No scleral icterus.    Extraocular Movements: Extraocular movements intact.  Cardiovascular:     Rate and Rhythm: Normal  rate and regular rhythm.     Heart sounds: Normal heart sounds. No murmur heard.    No friction rub. No gallop.  Pulmonary:     Effort: Pulmonary effort is normal. No respiratory distress.     Breath sounds: Normal breath sounds. No wheezing, rhonchi or rales.  Abdominal:     General: Bowel sounds are normal. There is no distension.     Palpations: Abdomen is soft.     Tenderness: There is no abdominal tenderness. There is no guarding or rebound.  Musculoskeletal:     Cervical back: Neck supple.     Right lower leg: No edema.     Left lower leg: No edema.  Skin:    General: Skin is warm and dry.     Coloration: Skin is not jaundiced or pale.  Neurological:     General: No focal deficit present.     Mental Status: He is alert and oriented to person, place, and time. Mental status is at baseline.  Psychiatric:        Mood and Affect: Mood normal.        Behavior: Behavior normal.        Thought Content: Thought content normal.        Judgment: Judgment normal.      Assessment:  [name] is a 68 y.o. male  who presents today for Esophagogastroduodenoscopy and Colonoscopy for GERD, personal history of colon polyps.  Plan:  Esophagogastroduodenoscopy and Colonoscopy with possible intervention today  Esophagogastroduodenoscopy and Colonoscopy with possible biopsy, control of bleeding, polypectomy, and interventions  as necessary has been discussed with the patient/patient representative. Informed consent was obtained from the patient/patient representative after explaining the indication, nature, and risks of the procedure including but not limited to death, bleeding, perforation, missed neoplasm/lesions, cardiorespiratory compromise, and reaction to medications. Opportunity for questions was given and appropriate answers were provided. Patient/patient representative has verbalized understanding is amenable to undergoing the procedure.   Jaynie Collins, DO  [facility]  Portions of the record may have been created with voice recognition software. Occasional wrong-word or 'sound-a-like' substitutions may have occurred due to the inherent limitations of voice recognition software.  Read the chart carefully and recognize, using context, where substitutions may have occurred.

## 2024-01-15 ENCOUNTER — Encounter: Payer: Self-pay | Admitting: Gastroenterology

## 2024-01-15 ENCOUNTER — Encounter
Admission: RE | Disposition: A | Discharge status: Home / Self Care | Payer: Self-pay | Source: Home / Self Care | Attending: Gastroenterology

## 2024-01-15 ENCOUNTER — Ambulatory Visit
Admission: RE | Admit: 2024-01-15 | Discharge: 2024-01-15 | Disposition: A | Discharge status: Home / Self Care | Payer: Medicare HMO | Attending: Gastroenterology | Admitting: Gastroenterology

## 2024-01-15 ENCOUNTER — Encounter: Payer: Self-pay | Admitting: Anesthesiology

## 2024-01-15 DIAGNOSIS — Z860101 Personal history of adenomatous and serrated colon polyps: Secondary | ICD-10-CM | POA: Diagnosis not present

## 2024-01-15 DIAGNOSIS — Z1211 Encounter for screening for malignant neoplasm of colon: Secondary | ICD-10-CM | POA: Diagnosis not present

## 2024-01-15 DIAGNOSIS — K219 Gastro-esophageal reflux disease without esophagitis: Secondary | ICD-10-CM | POA: Diagnosis not present

## 2024-01-15 DIAGNOSIS — Z7901 Long term (current) use of anticoagulants: Secondary | ICD-10-CM | POA: Insufficient documentation

## 2024-01-15 DIAGNOSIS — Z538 Procedure and treatment not carried out for other reasons: Secondary | ICD-10-CM | POA: Diagnosis not present

## 2024-01-15 HISTORY — PX: ESOPHAGOGASTRODUODENOSCOPY (EGD) WITH PROPOFOL: SHX5813

## 2024-01-15 HISTORY — DX: Strain of muscle(s) and tendon(s) of the rotator cuff of right shoulder, initial encounter: S46.011A

## 2024-01-15 HISTORY — PX: COLONOSCOPY WITH PROPOFOL: SHX5780

## 2024-01-15 HISTORY — DX: Sleep apnea, unspecified: G47.30

## 2024-01-15 SURGERY — COLONOSCOPY WITH PROPOFOL
Anesthesia: General

## 2024-01-15 MED ORDER — SODIUM CHLORIDE 0.9 % IV SOLN: INTRAVENOUS | Status: DC

## 2024-01-15 NOTE — H&P (Signed)
Pt prep not adequate for colonoscopy. Eliquis taken <48 hours ago at time of procedure.  Will reschedule.  Enis Slipper, DO [facility]

## 2024-01-16 ENCOUNTER — Encounter: Payer: Self-pay | Admitting: Gastroenterology

## 2024-02-16 DIAGNOSIS — Z800 Family history of malignant neoplasm of digestive organs: Secondary | ICD-10-CM | POA: Diagnosis not present

## 2024-02-16 DIAGNOSIS — K219 Gastro-esophageal reflux disease without esophagitis: Secondary | ICD-10-CM | POA: Diagnosis not present

## 2024-02-16 DIAGNOSIS — Z860101 Personal history of adenomatous and serrated colon polyps: Secondary | ICD-10-CM | POA: Diagnosis not present

## 2024-03-21 DIAGNOSIS — I10 Essential (primary) hypertension: Secondary | ICD-10-CM | POA: Diagnosis not present

## 2024-03-21 DIAGNOSIS — E118 Type 2 diabetes mellitus with unspecified complications: Secondary | ICD-10-CM | POA: Diagnosis not present

## 2024-03-28 DIAGNOSIS — Z0000 Encounter for general adult medical examination without abnormal findings: Secondary | ICD-10-CM | POA: Diagnosis not present

## 2024-03-28 DIAGNOSIS — Z1331 Encounter for screening for depression: Secondary | ICD-10-CM | POA: Diagnosis not present

## 2024-03-28 DIAGNOSIS — I480 Paroxysmal atrial fibrillation: Secondary | ICD-10-CM | POA: Diagnosis not present

## 2024-03-28 DIAGNOSIS — G4733 Obstructive sleep apnea (adult) (pediatric): Secondary | ICD-10-CM | POA: Diagnosis not present

## 2024-03-28 DIAGNOSIS — M79672 Pain in left foot: Secondary | ICD-10-CM | POA: Diagnosis not present

## 2024-03-28 DIAGNOSIS — I10 Essential (primary) hypertension: Secondary | ICD-10-CM | POA: Diagnosis not present

## 2024-03-28 DIAGNOSIS — E7800 Pure hypercholesterolemia, unspecified: Secondary | ICD-10-CM | POA: Diagnosis not present

## 2024-03-28 DIAGNOSIS — E119 Type 2 diabetes mellitus without complications: Secondary | ICD-10-CM | POA: Diagnosis not present

## 2024-04-11 ENCOUNTER — Ambulatory Visit: Admitting: Certified Registered"

## 2024-04-11 ENCOUNTER — Encounter
Admission: RE | Disposition: A | Discharge status: Home / Self Care | Payer: Self-pay | Source: Home / Self Care | Attending: Gastroenterology

## 2024-04-11 ENCOUNTER — Encounter: Payer: Self-pay | Admitting: Gastroenterology

## 2024-04-11 ENCOUNTER — Ambulatory Visit
Admission: RE | Admit: 2024-04-11 | Discharge: 2024-04-11 | Disposition: A | Discharge status: Home / Self Care | Attending: Gastroenterology | Admitting: Gastroenterology

## 2024-04-11 DIAGNOSIS — D124 Benign neoplasm of descending colon: Secondary | ICD-10-CM | POA: Insufficient documentation

## 2024-04-11 DIAGNOSIS — Z79899 Other long term (current) drug therapy: Secondary | ICD-10-CM | POA: Insufficient documentation

## 2024-04-11 DIAGNOSIS — M1990 Unspecified osteoarthritis, unspecified site: Secondary | ICD-10-CM | POA: Diagnosis not present

## 2024-04-11 DIAGNOSIS — K3189 Other diseases of stomach and duodenum: Secondary | ICD-10-CM | POA: Diagnosis not present

## 2024-04-11 DIAGNOSIS — K449 Diaphragmatic hernia without obstruction or gangrene: Secondary | ICD-10-CM | POA: Insufficient documentation

## 2024-04-11 DIAGNOSIS — K2980 Duodenitis without bleeding: Secondary | ICD-10-CM | POA: Insufficient documentation

## 2024-04-11 DIAGNOSIS — K640 First degree hemorrhoids: Secondary | ICD-10-CM | POA: Diagnosis not present

## 2024-04-11 DIAGNOSIS — K2289 Other specified disease of esophagus: Secondary | ICD-10-CM | POA: Diagnosis not present

## 2024-04-11 DIAGNOSIS — K219 Gastro-esophageal reflux disease without esophagitis: Secondary | ICD-10-CM | POA: Diagnosis not present

## 2024-04-11 DIAGNOSIS — D123 Benign neoplasm of transverse colon: Secondary | ICD-10-CM | POA: Insufficient documentation

## 2024-04-11 DIAGNOSIS — Z860101 Personal history of adenomatous and serrated colon polyps: Secondary | ICD-10-CM | POA: Diagnosis not present

## 2024-04-11 DIAGNOSIS — K2970 Gastritis, unspecified, without bleeding: Secondary | ICD-10-CM | POA: Diagnosis not present

## 2024-04-11 DIAGNOSIS — E66813 Obesity, class 3: Secondary | ICD-10-CM | POA: Diagnosis not present

## 2024-04-11 DIAGNOSIS — I10 Essential (primary) hypertension: Secondary | ICD-10-CM | POA: Diagnosis not present

## 2024-04-11 DIAGNOSIS — Z7985 Long-term (current) use of injectable non-insulin antidiabetic drugs: Secondary | ICD-10-CM | POA: Insufficient documentation

## 2024-04-11 DIAGNOSIS — K319 Disease of stomach and duodenum, unspecified: Secondary | ICD-10-CM | POA: Diagnosis not present

## 2024-04-11 DIAGNOSIS — Z6836 Body mass index (BMI) 36.0-36.9, adult: Secondary | ICD-10-CM | POA: Diagnosis not present

## 2024-04-11 DIAGNOSIS — K259 Gastric ulcer, unspecified as acute or chronic, without hemorrhage or perforation: Secondary | ICD-10-CM | POA: Diagnosis not present

## 2024-04-11 DIAGNOSIS — Z800 Family history of malignant neoplasm of digestive organs: Secondary | ICD-10-CM | POA: Insufficient documentation

## 2024-04-11 DIAGNOSIS — I4891 Unspecified atrial fibrillation: Secondary | ICD-10-CM | POA: Insufficient documentation

## 2024-04-11 DIAGNOSIS — K2990 Gastroduodenitis, unspecified, without bleeding: Secondary | ICD-10-CM | POA: Diagnosis not present

## 2024-04-11 DIAGNOSIS — G4730 Sleep apnea, unspecified: Secondary | ICD-10-CM | POA: Insufficient documentation

## 2024-04-11 DIAGNOSIS — I480 Paroxysmal atrial fibrillation: Secondary | ICD-10-CM | POA: Diagnosis not present

## 2024-04-11 DIAGNOSIS — K269 Duodenal ulcer, unspecified as acute or chronic, without hemorrhage or perforation: Secondary | ICD-10-CM | POA: Diagnosis not present

## 2024-04-11 DIAGNOSIS — K2270 Barrett's esophagus without dysplasia: Secondary | ICD-10-CM | POA: Insufficient documentation

## 2024-04-11 DIAGNOSIS — Z1211 Encounter for screening for malignant neoplasm of colon: Secondary | ICD-10-CM | POA: Insufficient documentation

## 2024-04-11 DIAGNOSIS — E119 Type 2 diabetes mellitus without complications: Secondary | ICD-10-CM | POA: Diagnosis not present

## 2024-04-11 DIAGNOSIS — K635 Polyp of colon: Secondary | ICD-10-CM | POA: Diagnosis not present

## 2024-04-11 DIAGNOSIS — K5730 Diverticulosis of large intestine without perforation or abscess without bleeding: Secondary | ICD-10-CM | POA: Diagnosis not present

## 2024-04-11 DIAGNOSIS — K2960 Other gastritis without bleeding: Secondary | ICD-10-CM | POA: Diagnosis not present

## 2024-04-11 HISTORY — PX: COLONOSCOPY: SHX5424

## 2024-04-11 HISTORY — PX: POLYPECTOMY: SHX149

## 2024-04-11 HISTORY — PX: ESOPHAGOGASTRODUODENOSCOPY: SHX5428

## 2024-04-11 LAB — GLUCOSE, CAPILLARY: Glucose-Capillary: 128 mg/dL — ABNORMAL HIGH (ref 70–99)

## 2024-04-11 SURGERY — COLONOSCOPY
Anesthesia: General

## 2024-04-11 MED ORDER — GLYCOPYRROLATE 0.2 MG/ML IJ SOLN
INTRAMUSCULAR | Status: DC | PRN
  Administered 2024-04-11: .2 mg via INTRAVENOUS

## 2024-04-11 MED ORDER — PROPOFOL 10 MG/ML IV BOLUS
INTRAVENOUS | Status: DC | PRN
  Administered 2024-04-11: 40 mg via INTRAVENOUS
  Administered 2024-04-11: 30 mg via INTRAVENOUS
  Administered 2024-04-11: 70 mg via INTRAVENOUS

## 2024-04-11 MED ORDER — EPHEDRINE SULFATE-NACL 50-0.9 MG/10ML-% IV SOSY
PREFILLED_SYRINGE | INTRAVENOUS | Status: DC | PRN
  Administered 2024-04-11 (×2): 5 mg via INTRAVENOUS

## 2024-04-11 MED ORDER — LIDOCAINE HCL (CARDIAC) PF 100 MG/5ML IV SOSY
PREFILLED_SYRINGE | INTRAVENOUS | Status: DC | PRN
  Administered 2024-04-11: 100 mg via INTRAVENOUS

## 2024-04-11 MED ORDER — PROPOFOL 500 MG/50ML IV EMUL
INTRAVENOUS | Status: DC | PRN
  Administered 2024-04-11: 145 ug/kg/min via INTRAVENOUS

## 2024-04-11 MED ORDER — DEXMEDETOMIDINE HCL IN NACL 200 MCG/50ML IV SOLN
INTRAVENOUS | Status: DC | PRN
  Administered 2024-04-11: 8 ug via INTRAVENOUS
  Administered 2024-04-11: 4 ug via INTRAVENOUS

## 2024-04-11 MED ORDER — SODIUM CHLORIDE 0.9 % IV SOLN
INTRAVENOUS | Status: DC
Start: 2024-04-11 — End: 2024-04-11

## 2024-04-11 MED ORDER — PHENYLEPHRINE 80 MCG/ML (10ML) SYRINGE FOR IV PUSH (FOR BLOOD PRESSURE SUPPORT)
PREFILLED_SYRINGE | INTRAVENOUS | Status: DC | PRN
  Administered 2024-04-11: 160 ug via INTRAVENOUS
  Administered 2024-04-11 (×2): 80 ug via INTRAVENOUS
  Administered 2024-04-11: 160 ug via INTRAVENOUS

## 2024-04-11 NOTE — Op Note (Signed)
 [facility] Gastroenterology Patient Name: Connor Freeman Procedure Date: 04/11/2024 8:13 AM MRN: [id number] Account #: [id number] Date of Birth: October 13, 1956 Admit Type: Outpatient Age: 68 Room: [facility] ENDO ROOM 1 Gender: Male Note Status: Finalized Instrument Name: Upper Endoscope [id number] Procedure:             Upper GI endoscopy Indications:           Gastro-esophageal reflux disease Providers:             [name] DO, DO Medicines:             Monitored Anesthesia Care Complications:         No immediate complications. Estimated blood loss:                         Minimal. Procedure:             Pre-Anesthesia Assessment:                        - Prior to the procedure, a History and Physical was                         performed, and patient medications and allergies were                         reviewed. The patient is competent. The risks and                         benefits of the procedure and the sedation options and                         risks were discussed with the patient. All questions                         were answered and informed consent was obtained.                         Patient identification and proposed procedure were                         verified by the physician, the nurse, the anesthetist                         and the technician in the endoscopy suite. Mental                         Status Examination: alert and oriented. Airway                         Examination: normal oropharyngeal airway and neck                         mobility. Respiratory Examination: clear to                         auscultation. CV Examination: RRR, no murmurs, no S3                         or S4. Prophylactic Antibiotics: The patient does  not                         require prophylactic antibiotics. Prior                         Anticoagulants: The patient has taken Eliquis                          (apixaban ), last dose was 7 days prior to  procedure.                         ASA Grade Assessment: III - A patient with severe                         systemic disease. After reviewing the risks and                         benefits, the patient was deemed in satisfactory                         condition to undergo the procedure. The anesthesia                         plan was to use monitored anesthesia care (MAC).                         Immediately prior to administration of medications,                         the patient was re-assessed for adequacy to receive                         sedatives. The heart rate, respiratory rate, oxygen                         saturations, blood pressure, adequacy of pulmonary                         ventilation, and response to care were monitored                         throughout the procedure. The physical status of the                         patient was re-assessed after the procedure.                        After obtaining informed consent, the endoscope was                         passed under direct vision. Throughout the procedure,                         the patient's blood pressure, pulse, and oxygen                         saturations were monitored continuously. The Endoscope  was introduced through the mouth, and advanced to the                         third part of duodenum. The upper GI endoscopy was                         accomplished without difficulty. The patient tolerated                         the procedure well. Findings:      One non-bleeding superficial duodenal ulcer with a clean ulcer base       (Forrest Class III) was found in the duodenal bulb. The lesion was 3 mm       in largest dimension. Biopsies were taken with a cold forceps for       histology. Estimated blood loss was minimal.      Localized moderate inflammation characterized by erosions was found in       the duodenal bulb and in the first portion of the duodenum. Biopsies        were taken with a cold forceps for histology. Estimated blood loss was       minimal.      The exam of the duodenum was otherwise normal.      A small hiatal hernia was present. Estimated blood loss: none.      A single 2 to 3 mm mucosal papule (nodule) with no bleeding and no       stigmata of recent bleeding was found in the cardia. The nodule was       Paris classification Is (protruding, sessile). Biopsies were taken with       a cold forceps for histology. Estimated blood loss was minimal.      Localized moderate inflammation characterized by erosions and erythema       was found in the entire examined stomach. Biopsies were taken with a       cold forceps for Helicobacter pylori testing. Estimated blood loss was       minimal.      The exam of the stomach was otherwise normal.      Esophagogastric landmarks were identified: the gastroesophageal junction       was found at 40 cm from the incisors.      The esophagus and gastroesophageal junction were examined with white       light and narrow band imaging (NBI). There were esophageal mucosal       changes classified as Barrett's stage C8-M10 per Prague criteria. These       changes involved the mucosa extending to the Z-line. The maximum       longitudinal extent of these esophageal mucosal changes was 10 cm in       length. Mucosa was biopsied with a cold forceps for histology. A total       of 6 specimen bottles were sent to pathology. Biopsies taken in 4       quadrant fashion every two centimeters      The exam of the esophagus was otherwise normal. Impression:            - Non-bleeding duodenal ulcer with a clean ulcer base                         (Forrest Class III). Biopsied.                        -  Duodenitis. Biopsied.                        - Small hiatal hernia.                        - A single mucosal papule (nodule) found in the                         stomach. Biopsied.                        - Gastritis.  Biopsied.                        - Esophagogastric landmarks identified.                        - Esophageal mucosal changes classified as Barrett's                         stage C8-M10 per Prague criteria. Biopsied. Recommendation:        - Patient has a contact number available for                         emergencies. The signs and symptoms of potential                         delayed complications were discussed with the patient.                         Return to normal activities tomorrow. Written                         discharge instructions were provided to the patient.                        - Discharge patient to home.                        - Resume previous diet.                        - Continue present medications.                        - No aspirin, ibuprofen, naproxen, or other                         non-steroidal anti-inflammatory drugs.                        - Resume Eliquis  (apixaban ) at prior dose in 3 days.                         Refer to managing physician for further adjustment of                         therapy.                        - Await pathology results.                        -  Repeat upper endoscopy for surveillance based on                         pathology results.                        - Return to GI clinic as previously scheduled.                        - Increase ppi to 40 mg twice daily                        - proceed with colonoscopy. see report for further                         recommendations.                        - The findings and recommendations were discussed with                         the patient. Procedure Code(s):     --- Professional ---                        [phone], Esophagogastroduodenoscopy, flexible,                         transoral; with biopsy, single or multiple Diagnosis Code(s):     --- Professional ---                        K26.9, Duodenal ulcer, unspecified as acute or                         chronic, without  hemorrhage or perforation                        K29.80, Duodenitis without bleeding                        K44.9, Diaphragmatic hernia without obstruction or                         gangrene                        K31.89, Other diseases of stomach and duodenum                        K29.70, Gastritis, unspecified, without bleeding                        K22.70, Barrett's esophagus without dysplasia                        K21.9, Gastro-esophageal reflux disease without                         esophagitis CPT copyright 2022 American Medical Association. All rights reserved. The codes documented in this report are preliminary and upon coder review may  be revised to meet current compliance requirements. Attending Participation:      I  personally performed the entire procedure. Polo Brisk, DO [name] DO, DO 04/11/2024 8:47:18 AM This report has been signed electronically. Number of Addenda: 0 Note Initiated On: 04/11/2024 8:13 AM Estimated Blood Loss:  Estimated blood loss was minimal.      [facility]

## 2024-04-11 NOTE — Interval H&P Note (Signed)
 History and Physical Interval Note: Preprocedure H&P from 04/11/24  was reviewed and there was no interval change after seeing and examining the patient.  Written consent was obtained from the patient after discussion of risks, benefits, and alternatives. Patient has consented to proceed with Esophagogastroduodenoscopy and Colonoscopy with possible intervention   04/11/2024 8:20 AM  Connor Freeman  has presented today for surgery, with the diagnosis of Z86.0101 (ICD-10-CM) - Hx of adenomatous colonic polyps K21.9 (ICD-10-CM) - Gastroesophageal reflux disease, unspecified whether esophagitis present Z80.0 (ICD-10-CM) - FH: colon cancer in relative diagnosed at >36 years old.  The various methods of treatment have been discussed with the patient and family. After consideration of risks, benefits and other options for treatment, the patient has consented to  Procedure(s): COLONOSCOPY (N/A) EGD (ESOPHAGOGASTRODUODENOSCOPY) (N/A) as a surgical intervention.  The patient's history has been reviewed, patient examined, no change in status, stable for surgery.  I have reviewed the patient's chart and labs.  Questions were answered to the patient's satisfaction.     [name]

## 2024-04-11 NOTE — Op Note (Signed)
 [facility] Gastroenterology Patient Name: Connor Freeman Procedure Date: 04/11/2024 8:13 AM MRN: [id number] Account #: [id number] Date of Birth: [birth year] Admit Type: Outpatient Age: 68 Room: [facility] ENDO ROOM 1 Gender: Male Note Status: Finalized Instrument Name: Charlyn Cooley [id number] Procedure:             Colonoscopy Indications:           High risk colon cancer surveillance: Personal history                         of colonic polyps, Family history of rectal cancer in                         a first-degree relative before age 45 years Providers:             [name] DO, DO Medicines:             Monitored Anesthesia Care Complications:         No immediate complications. Estimated blood loss:                         Minimal. Procedure:             Pre-Anesthesia Assessment:                        - Prior to the procedure, a History and Physical was                         performed, and patient medications and allergies were                         reviewed. The patient is competent. The risks and                         benefits of the procedure and the sedation options and                         risks were discussed with the patient. All questions                         were answered and informed consent was obtained.                         Patient identification and proposed procedure were                         verified by the physician, the nurse, the anesthetist                         and the technician in the endoscopy suite. Mental                         Status Examination: alert and oriented. Airway                         Examination: normal oropharyngeal airway and neck                         mobility. Respiratory Examination:  clear to                         auscultation. CV Examination: RRR, no murmurs, no S3                         or S4. Prophylactic Antibiotics: The patient does not                         require prophylactic  antibiotics. Prior                         Anticoagulants: The patient has taken Eliquis                          (apixaban ), last dose was 7 days prior to procedure.                         ASA Grade Assessment: III - A patient with severe                         systemic disease. After reviewing the risks and                         benefits, the patient was deemed in satisfactory                         condition to undergo the procedure. The anesthesia                         plan was to use monitored anesthesia care (MAC).                         Immediately prior to administration of medications,                         the patient was re-assessed for adequacy to receive                         sedatives. The heart rate, respiratory rate, oxygen                         saturations, blood pressure, adequacy of pulmonary                         ventilation, and response to care were monitored                         throughout the procedure. The physical status of the                         patient was re-assessed after the procedure.                        After obtaining informed consent, the colonoscope was                         passed under direct vision. Throughout the procedure,  the patient's blood pressure, pulse, and oxygen                         saturations were monitored continuously. The                         Colonoscope was introduced through the anus and                         advanced to the the cecum, identified by appendiceal                         orifice and ileocecal valve. The colonoscopy was                         somewhat difficult due to a redundant colon and the                         patient's body habitus. Successful completion of the                         procedure was aided by straightening and shortening                         the scope to obtain bowel loop reduction, using scope                         torsion and lavage.  The patient tolerated the                         procedure well. The quality of the bowel preparation                         was evaluated using the BBPS [facility] Bowel Preparation                         Scale) with scores of: Right Colon = 2 (minor amount                         of residual staining, small fragments of stool and/or                         opaque liquid, but mucosa seen well), Transverse Colon                         = 3 (entire mucosa seen well with no residual                         staining, small fragments of stool or opaque liquid)                         and Left Colon = 3 (entire mucosa seen well with no                         residual staining, small fragments of stool or opaque  liquid). The total BBPS score equals 8. The quality of                         the bowel preparation was excellent. The ileocecal                         valve, appendiceal orifice, and rectum were                         photographed. Findings:      The perianal and digital rectal examinations were normal. Pertinent       negatives include normal sphincter tone.      A 6 to 8 mm polyp was found in the transverse colon. The polyp was       sessile. The polyp was removed with a cold snare. Resection and       retrieval were complete. Estimated blood loss was minimal.      A 1 to 2 mm polyp was found in the descending colon. The polyp was       sessile. The polyp was removed with a jumbo cold forceps. Resection and       retrieval were complete. Estimated blood loss was minimal.      Multiple small-mouthed diverticula were found in the sigmoid colon.       Estimated blood loss: none.      Non-bleeding internal hemorrhoids were found during retroflexion. The       hemorrhoids were Grade I (internal hemorrhoids that do not prolapse).       Estimated blood loss: none.      The exam was otherwise without abnormality on direct and retroflexion        views. Impression:            - One 6 to 8 mm polyp in the transverse colon, removed                         with a cold snare. Resected and retrieved.                        - One 1 to 2 mm polyp in the descending colon, removed                         with a jumbo cold forceps. Resected and retrieved.                        - Diverticulosis in the sigmoid colon.                        - Non-bleeding internal hemorrhoids.                        - The examination was otherwise normal on direct and                         retroflexion views. Recommendation:        - Patient has a contact number available for                         emergencies. The signs and symptoms of potential  delayed complications were discussed with the patient.                         Return to normal activities tomorrow. Written                         discharge instructions were provided to the patient.                        - Discharge patient to home.                        - Resume previous diet.                        - Continue present medications.                        - No aspirin, ibuprofen, naproxen, or other                         non-steroidal anti-inflammatory drugs for 5 days after                         polyp removal.                        - Resume Eliquis  (apixaban ) at prior dose in 3 days.                         Refer to managing physician for further adjustment of                         therapy.                        - Await pathology results.                        - Repeat colonoscopy for surveillance based on                         pathology results.                        - Return to GI office as previously scheduled.                        - The findings and recommendations were discussed with                         the patient. Procedure Code(s):     --- Professional ---                        [phone], Colonoscopy, flexible; with removal of                          tumor(s), polyp(s), or other lesion(s) by snare                         technique  81191, 59, Colonoscopy, flexible; with biopsy, single                         or multiple Diagnosis Code(s):     --- Professional ---                        Z86.010, Personal history of colonic polyps                        D12.3, Benign neoplasm of transverse colon (hepatic                         flexure or splenic flexure)                        D12.4, Benign neoplasm of descending colon                        K64.0, First degree hemorrhoids                        Z80.0, Family history of malignant neoplasm of                         digestive organs                        K57.30, Diverticulosis of large intestine without                         perforation or abscess without bleeding CPT copyright 2022 American Medical Association. All rights reserved. The codes documented in this report are preliminary and upon coder review may  be revised to meet current compliance requirements. Attending Participation:      I personally performed the entire procedure. Polo Brisk, DO [name] DO, DO 04/11/2024 9:13:26 AM This report has been signed electronically. Number of Addenda: 0 Note Initiated On: 04/11/2024 8:13 AM Scope Withdrawal Time: 0 hours 10 minutes 33 seconds  Total Procedure Duration: 0 hours 17 minutes 43 seconds  Estimated Blood Loss:  Estimated blood loss was minimal.      [facility]

## 2024-04-11 NOTE — Anesthesia Preprocedure Evaluation (Signed)
 Anesthesia Evaluation  Patient identified by MRN, date of birth, ID band Patient awake    Reviewed: Allergy & Precautions, H&P , NPO status , Patient's Chart, lab work & pertinent test results, reviewed documented beta blocker date and time   History of Anesthesia Complications Negative for: history of anesthetic complications  Airway Mallampati: III  TM Distance: >3 FB Neck ROM: full    Dental  (+) Poor Dentition, Partial Upper, Dental Advidsory Given   Pulmonary neg shortness of breath, sleep apnea and Continuous Positive Airway Pressure Ventilation , neg COPD, neg recent URI   Pulmonary exam normal        Cardiovascular Exercise Tolerance: Poor hypertension, On Medications (-) angina (-) Past MI negative cardio ROS Normal cardiovascular exam+ dysrhythmias Atrial Fibrillation (-) Valvular Problems/Murmurs Rhythm:regular Rate:Normal     Neuro/Psych negative neurological ROS  negative psych ROS   GI/Hepatic negative GI ROS, Neg liver ROS,GERD  Medicated,,  Endo/Other  negative endocrine ROSdiabetes, Well Controlled, Oral Hypoglycemic Agents  Class 3 obesity  Renal/GU negative Renal ROS  negative genitourinary   Musculoskeletal  (+) Arthritis ,    Abdominal   Peds  Hematology negative hematology ROS (+)   Anesthesia Other Findings Past Medical History: No date: Arthritis     Comment:  hands and wrist No date: Atrial fibrillation (HCC)     Comment:  a. 04/2012 incidentally noted preop shoulder surgery.                CHA2DS2VASc = 2; b. 04/2021 Echo: EF 60-65%.  No rwma. Nl               RV size/fxn.  No significant valvular disease. No date: Chronic anticoagulation     Comment:  Apixaban  No date: GERD (gastroesophageal reflux disease) 08/11/2021: History of 2019 novel coronavirus disease (COVID-19)     Comment:  home test (+) No date: Hyperlipidemia No date: Hypertension No date: Obesity No date: Right bundle  branch block 1990: Skin cancer     Comment:  throat and neck area No date: Sleep difficulties     Comment:  takes melatonin No date: Snores No date: Type II diabetes mellitus (HCC) Past Surgical History: 1990: excision on skin cancer  BMI    Body Mass Index: 36.62 kg/m     Reproductive/Obstetrics negative OB ROS                             Anesthesia Physical Anesthesia Plan  ASA: 3  Anesthesia Plan: General   Post-op Pain Management:    Induction: Intravenous  PONV Risk Score and Plan: 3 and Propofol  infusion and TIVA  Airway Management Planned: Natural Airway and Nasal Cannula  Additional Equipment:   Intra-op Plan:   Post-operative Plan:   Informed Consent: I have reviewed the patients History and Physical, chart, labs and discussed the procedure including the risks, benefits and alternatives for the proposed anesthesia with the patient or authorized representative who has indicated his/her understanding and acceptance.     Dental Advisory Given  Plan Discussed with: CRNA  Anesthesia Plan Comments:         Anesthesia Quick Evaluation

## 2024-04-11 NOTE — Anesthesia Procedure Notes (Signed)
 Procedure Name: General with mask airway Date/Time: 04/11/2024 8:20 AM  Performed by: Niki Barter, CRNAPre-anesthesia Checklist: Patient identified, Emergency Drugs available, Suction available and Patient being monitored Patient Re-evaluated:Patient Re-evaluated prior to induction Oxygen Delivery Method: Simple face mask Induction Type: IV induction Placement Confirmation: positive ETCO2 and breath sounds checked- equal and bilateral Dental Injury: Teeth and Oropharynx as per pre-operative assessment

## 2024-04-11 NOTE — H&P (Signed)
 Pre-Procedure H&P   Patient ID: Connor Freeman is a 68 y.o. male.  Gastroenterology Provider: [name], DO  Referring Provider: Rodena Citron, NP PCP: Connor Moulding, MD  Date: 04/11/2024  HPI [name] is a 68 y.o. male who presents today for Esophagogastroduodenoscopy and Colonoscopy for GERD, personal history of colon polyps, family history of colon cancer .  Patient last underwent EGD and colonoscopy in December 2016 with LA grade C esophagitis, gastritis and duodenitis.  1 tubular adenomatous polyp, diverticulosis and internal hemorrhoids.  Sister with a history of rectal cancer in her 51s  Patient reports twice daily bowel movement without melena hematochezia diarrhea or constipation  His Eliquis  and Ozempic have been held for today  Creatinine 0.8   Past Medical History:  Diagnosis Date   Arthritis    hands and wrist   Atrial fibrillation (HCC)    a. 04/2012 incidentally noted preop shoulder surgery.  CHA2DS2VASc = 2; b. 04/2021 Echo: EF 60-65%.  No rwma. Nl RV size/fxn.  No significant valvular disease.   Chronic anticoagulation    Apixaban    GERD (gastroesophageal reflux disease)    History of 2019 novel coronavirus disease (COVID-19) 08/11/2021   home test (+)   Hyperlipidemia    Hypertension    Obesity    Right bundle branch block    Skin cancer 1990   throat and neck area   Sleep apnea    Sleep difficulties    takes melatonin   Snores    Traumatic complete tear of right rotator cuff    Type II diabetes mellitus (HCC)     Past Surgical History:  Procedure Laterality Date   BICEPT TENODESIS  08/24/2021   Procedure: BICEPS TENODESIS;  Surgeon: Connor Hahn, MD;  Location: [facility] ORS;  Service: Orthopedics;;   COLONOSCOPY WITH PROPOFOL  N/A 01/15/2024   Procedure: COLONOSCOPY WITH PROPOFOL ;  Surgeon: Connor Buckle, DO;  Location: [facility] END[facility]COPY;  Service: Gastroenterology;  Laterality: N/A;  DM   ESOPHAGOGASTRODUODENOSCOPY (EGD)  WITH PROPOFOL  N/A 01/15/2024   Procedure: ESOPHAGOGASTRODUODENOSCOPY (EGD) WITH PROPOFOL ;  Surgeon: Connor Buckle, DO;  Location: [facility] END[facility]COPY;  Service: Gastroenterology;  Laterality: N/A;   excision on skin cancer   1990   FRACTURE SURGERY     SHOULDER ARTHROSCOPY WITH OPEN ROTATOR CUFF REPAIR Right 08/24/2021   Procedure: SHOULDER ARTHROSCOPY WITH DEBRIDEMENT, DECOMPRESSION, AND  ROTATOR CUFF REPAIR;  Surgeon: Connor Hahn, MD;  Location: [facility] ORS;  Service: Orthopedics;  Laterality: Right;    Family History Sister- rectal ca- 17s No other h/o GI disease or malignancy  Review of Systems  Constitutional:  Negative for activity change, appetite change, chills, diaphoresis, fatigue, fever and unexpected weight change.  HENT:  Negative for trouble swallowing and voice change.   Respiratory:  Negative for shortness of breath and wheezing.   Cardiovascular:  Negative for chest pain, palpitations and leg swelling.  Gastrointestinal:  Negative for abdominal distention, abdominal pain, anal bleeding, blood in stool, constipation, diarrhea, nausea and vomiting.  Musculoskeletal:  Negative for arthralgias and myalgias.  Skin:  Negative for color change and pallor.  Neurological:  Negative for dizziness, syncope and weakness.  Psychiatric/Behavioral:  Negative for confusion. The patient is not nervous/anxious.   All other systems reviewed and are negative.    Medications No current facility-administered medications on file prior to encounter.   Current Outpatient Medications on File Prior to Encounter  Medication Sig Dispense Refill   diltiazem  (TIAZAC ) 240 MG  24 hr capsule Take 240 mg by mouth daily.     acetaminophen  (TYLENOL ) 325 MG tablet Take 650 mg by mouth every 6 (six) hours as needed for moderate pain.     Cholecalciferol 10 MCG (400 UNIT) CHEW Chew by mouth.     Cyanocobalamin  (VITAMIN B-12) 5000 MCG SUBL Take 5,000 mcg by mouth in the morning.     ELIQUIS  5 MG TABS  tablet TAKE 1 TABLET TWICE A DAY 180 tablet 3   fluticasone (FLONASE) 50 MCG/ACT nasal spray Place into the nose.     Melatonin 10 MG CAPS Take 10 mg by mouth at bedtime as needed (sleep).     Multiple Vitamin (MULTIVITAMIN WITH MINERALS) TABS tablet Take 1 tablet by mouth in the morning. Centrum Silver     omeprazole (PRILOSEC) 40 MG capsule Take 40 mg by mouth in the morning.     OVER THE COUNTER MEDICATION Take 1 capsule by mouth daily. Alpha brain     OZEMPIC, 0.25 OR 0.5 MG/DOSE, 2 MG/3ML SOPN Inject into the skin.     pregabalin (LYRICA) 150 MG capsule Take 150 mg by mouth 2 (two) times daily.     rosuvastatin  (CRESTOR ) 40 MG tablet Take 40 mg by mouth in the morning.      Pertinent medications related to GI and procedure were reviewed by me with the patient prior to the procedure   Current Facility-Administered Medications:    0.9 %  sodium chloride  infusion, , Intravenous, Continuous, Connor Buckle, DO, Last Rate: 20 mL/hr at 04/11/24 0757, New Bag at 04/11/24 0757  sodium chloride  20 mL/hr at 04/11/24 0757       No Known Allergies Allergies were reviewed by me prior to the procedure  Objective   Body mass index is 36.35 kg/m. Vitals:   04/11/24 0729 04/11/24 0740 04/11/24 0744  BP:   (!) 160/99  Pulse:   82  Resp:   18  Temp:   (!) 97.5 F (36.4 C)  TempSrc:   Temporal  SpO2:   97%  Weight:  121.6 kg   Height: 6' (1.829 m)       Physical Exam Vitals and nursing note reviewed.  Constitutional:      General: He is not in acute distress.    Appearance: Normal appearance. He is obese. He is not ill-appearing, toxic-appearing or diaphoretic.  HENT:     Head: Normocephalic and atraumatic.     Nose: Nose normal.     Mouth/Throat:     Mouth: Mucous membranes are moist.     Pharynx: Oropharynx is clear.  Eyes:     General: No scleral icterus.    Extraocular Movements: Extraocular movements intact.  Cardiovascular:     Rate and Rhythm: Normal rate and  regular rhythm.     Heart sounds: Normal heart sounds. No murmur heard.    No friction rub. No gallop.  Pulmonary:     Effort: Pulmonary effort is normal. No respiratory distress.     Breath sounds: Normal breath sounds. No wheezing, rhonchi or rales.  Abdominal:     General: Bowel sounds are normal. There is no distension.     Palpations: Abdomen is soft.     Tenderness: There is no abdominal tenderness. There is no guarding or rebound.  Musculoskeletal:     Cervical back: Neck supple.     Right lower leg: No edema.     Left lower leg: No edema.  Skin:    General:  Skin is warm and dry.     Coloration: Skin is not jaundiced or pale.  Neurological:     General: No focal deficit present.     Mental Status: He is alert and oriented to person, place, and time. Mental status is at baseline.  Psychiatric:        Mood and Affect: Mood normal.        Behavior: Behavior normal.        Thought Content: Thought content normal.        Judgment: Judgment normal.      Assessment:  [name] is a 68 y.o. male  who presents today for Esophagogastroduodenoscopy and Colonoscopy for GERD, personal history of colon polyps, family history of colon cancer .  Plan:  Esophagogastroduodenoscopy and Colonoscopy with possible intervention today  Esophagogastroduodenoscopy and Colonoscopy with possible biopsy, control of bleeding, polypectomy, and interventions as necessary has been discussed with the patient/patient representative. Informed consent was obtained from the patient/patient representative after explaining the indication, nature, and risks of the procedure including but not limited to death, bleeding, perforation, missed neoplasm/lesions, cardiorespiratory compromise, and reaction to medications. Opportunity for questions was given and appropriate answers were provided. Patient/patient representative has verbalized understanding is amenable to undergoing the procedure.   Connor Buckle, DO  [facility]  Portions of the record may have been created with voice recognition software. Occasional wrong-word or 'sound-a-like' substitutions may have occurred due to the inherent limitations of voice recognition software.  Read the chart carefully and recognize, using context, where substitutions may have occurred.

## 2024-04-11 NOTE — Transfer of Care (Signed)
 Immediate Anesthesia Transfer of Care Note  Patient: Connor Freeman  Procedure(s) Performed: COLONOSCOPY EGD (ESOPHAGOGASTRODUODENOSCOPY) POLYPECTOMY, INTESTINE  Patient Location: Endoscopy Unit  Anesthesia Type:General  Level of Consciousness: drowsy and patient cooperative  Airway & Oxygen Therapy: Patient Spontanous Breathing and Patient connected to face mask oxygen  Post-op Assessment: Report given to RN and Post -op Vital signs reviewed and stable  Post vital signs: Reviewed and stable  Last Vitals:  Vitals Value Taken Time  BP 133/82 04/11/24 0917  Temp 36 C 04/11/24 0911  Pulse 57 04/11/24 0918  Resp 12 04/11/24 0918  SpO2 97 % 04/11/24 0918  Vitals shown include unfiled device data.  Last Pain:  Vitals:   04/11/24 0911  TempSrc: Temporal  PainSc: Asleep         Complications: No notable events documented.

## 2024-04-12 ENCOUNTER — Encounter: Payer: Self-pay | Admitting: Gastroenterology

## 2024-04-15 LAB — SURGICAL PATHOLOGY

## 2024-04-16 NOTE — Anesthesia Postprocedure Evaluation (Signed)
 Anesthesia Post Note  Patient: Connor Freeman  Procedure(s) Performed: COLONOSCOPY EGD (ESOPHAGOGASTRODUODENOSCOPY) POLYPECTOMY, INTESTINE  Patient location during evaluation: Endoscopy Anesthesia Type: General Level of consciousness: awake and alert Pain management: pain level controlled Vital Signs Assessment: post-procedure vital signs reviewed and stable Respiratory status: spontaneous breathing, nonlabored ventilation, respiratory function stable and patient connected to nasal cannula oxygen Cardiovascular status: blood pressure returned to baseline and stable Postop Assessment: no apparent nausea or vomiting Anesthetic complications: no   No notable events documented.   Last Vitals:  Vitals:   04/11/24 0922 04/11/24 0936  BP: 123/76 122/81  Pulse: 88 81  Resp: 17 14  Temp:    SpO2: 96% 95%    Last Pain:  Vitals:   04/11/24 0936  TempSrc:   PainSc: 0-No pain                 Vanice Genre

## 2024-07-17 DIAGNOSIS — M79672 Pain in left foot: Secondary | ICD-10-CM | POA: Diagnosis not present

## 2024-07-17 DIAGNOSIS — M19071 Primary osteoarthritis, right ankle and foot: Secondary | ICD-10-CM | POA: Diagnosis not present

## 2024-07-17 DIAGNOSIS — M19072 Primary osteoarthritis, left ankle and foot: Secondary | ICD-10-CM | POA: Diagnosis not present

## 2024-07-17 DIAGNOSIS — M792 Neuralgia and neuritis, unspecified: Secondary | ICD-10-CM | POA: Diagnosis not present

## 2024-07-17 DIAGNOSIS — M79671 Pain in right foot: Secondary | ICD-10-CM | POA: Diagnosis not present

## 2024-08-05 DIAGNOSIS — M79671 Pain in right foot: Secondary | ICD-10-CM | POA: Diagnosis not present

## 2024-08-05 DIAGNOSIS — M19071 Primary osteoarthritis, right ankle and foot: Secondary | ICD-10-CM | POA: Diagnosis not present

## 2024-08-05 DIAGNOSIS — M19072 Primary osteoarthritis, left ankle and foot: Secondary | ICD-10-CM | POA: Diagnosis not present

## 2024-08-05 DIAGNOSIS — E119 Type 2 diabetes mellitus without complications: Secondary | ICD-10-CM | POA: Diagnosis not present

## 2024-08-05 DIAGNOSIS — M79672 Pain in left foot: Secondary | ICD-10-CM | POA: Diagnosis not present

## 2024-08-05 DIAGNOSIS — M792 Neuralgia and neuritis, unspecified: Secondary | ICD-10-CM | POA: Diagnosis not present

## 2024-08-07 DIAGNOSIS — Z0100 Encounter for examination of eyes and vision without abnormal findings: Secondary | ICD-10-CM | POA: Diagnosis not present

## 2024-09-23 DIAGNOSIS — E118 Type 2 diabetes mellitus with unspecified complications: Secondary | ICD-10-CM | POA: Diagnosis not present

## 2024-09-23 DIAGNOSIS — E7800 Pure hypercholesterolemia, unspecified: Secondary | ICD-10-CM | POA: Diagnosis not present

## 2024-09-23 DIAGNOSIS — I10 Essential (primary) hypertension: Secondary | ICD-10-CM | POA: Diagnosis not present

## 2024-09-30 DIAGNOSIS — Z125 Encounter for screening for malignant neoplasm of prostate: Secondary | ICD-10-CM | POA: Diagnosis not present

## 2024-09-30 DIAGNOSIS — I10 Essential (primary) hypertension: Secondary | ICD-10-CM | POA: Diagnosis not present

## 2024-09-30 DIAGNOSIS — G4733 Obstructive sleep apnea (adult) (pediatric): Secondary | ICD-10-CM | POA: Diagnosis not present

## 2024-09-30 DIAGNOSIS — Z6838 Body mass index (BMI) 38.0-38.9, adult: Secondary | ICD-10-CM | POA: Diagnosis not present

## 2024-09-30 DIAGNOSIS — E119 Type 2 diabetes mellitus without complications: Secondary | ICD-10-CM | POA: Diagnosis not present

## 2024-09-30 DIAGNOSIS — E7800 Pure hypercholesterolemia, unspecified: Secondary | ICD-10-CM | POA: Diagnosis not present

## 2024-09-30 DIAGNOSIS — I480 Paroxysmal atrial fibrillation: Secondary | ICD-10-CM | POA: Diagnosis not present

## 2024-10-28 DIAGNOSIS — E119 Type 2 diabetes mellitus without complications: Secondary | ICD-10-CM | POA: Diagnosis not present
